# Patient Record
Sex: Female | Born: 1975
Health system: Southern US, Community
[De-identification: ages and names within clinical notes are randomized; demographics above are authoritative.]

## PROBLEM LIST (undated history)

## (undated) DIAGNOSIS — I10 Essential (primary) hypertension: Secondary | ICD-10-CM

## (undated) DIAGNOSIS — F329 Major depressive disorder, single episode, unspecified: Secondary | ICD-10-CM

## (undated) DIAGNOSIS — F32A Depression, unspecified: Secondary | ICD-10-CM

## (undated) HISTORY — DX: Depression, unspecified: F32.A

## (undated) HISTORY — DX: Essential (primary) hypertension: I10

## (undated) HISTORY — PX: CHEST TUBE INSERTION: SHX231

## (undated) HISTORY — PX: TOE SURGERY: SHX1073

## (undated) HISTORY — DX: Major depressive disorder, single episode, unspecified: F32.9

## (undated) HISTORY — PX: CHEST SURGERY: SHX595

---

## 1998-03-14 ENCOUNTER — Emergency Department (HOSPITAL_COMMUNITY): Admission: EM | Admit: 1998-03-14 | Discharge: 1998-03-14 | Payer: Self-pay | Admitting: Emergency Medicine

## 1998-08-20 ENCOUNTER — Other Ambulatory Visit: Admission: RE | Admit: 1998-08-20 | Discharge: 1998-08-20 | Payer: Self-pay | Admitting: *Deleted

## 1999-06-23 ENCOUNTER — Emergency Department (HOSPITAL_COMMUNITY): Admission: EM | Admit: 1999-06-23 | Discharge: 1999-06-23 | Payer: Self-pay | Admitting: Emergency Medicine

## 2000-10-31 ENCOUNTER — Other Ambulatory Visit: Admission: RE | Admit: 2000-10-31 | Discharge: 2000-10-31 | Payer: Self-pay | Admitting: *Deleted

## 2012-12-21 ENCOUNTER — Encounter: Payer: Self-pay | Admitting: Internal Medicine

## 2012-12-21 ENCOUNTER — Ambulatory Visit (INDEPENDENT_AMBULATORY_CARE_PROVIDER_SITE_OTHER): Payer: 59 | Admitting: Internal Medicine

## 2012-12-21 VITALS — BP 134/86 | HR 76 | Temp 99.4°F | Resp 18 | Ht 63.0 in | Wt 153.0 lb

## 2012-12-21 DIAGNOSIS — F411 Generalized anxiety disorder: Secondary | ICD-10-CM

## 2012-12-21 DIAGNOSIS — J309 Allergic rhinitis, unspecified: Secondary | ICD-10-CM | POA: Insufficient documentation

## 2012-12-21 DIAGNOSIS — F419 Anxiety disorder, unspecified: Secondary | ICD-10-CM

## 2012-12-21 DIAGNOSIS — I10 Essential (primary) hypertension: Secondary | ICD-10-CM

## 2012-12-21 DIAGNOSIS — F4310 Post-traumatic stress disorder, unspecified: Secondary | ICD-10-CM

## 2012-12-21 DIAGNOSIS — Z87828 Personal history of other (healed) physical injury and trauma: Secondary | ICD-10-CM

## 2012-12-21 MED ORDER — TRIAMTERENE-HCTZ 37.5-25 MG PO CAPS: 1.0000 | ORAL_CAPSULE | ORAL | Status: DC

## 2012-12-21 NOTE — Patient Instructions (Addendum)
See me in 4 weeks 
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
## 2013-01-10 ENCOUNTER — Encounter: Payer: Self-pay | Admitting: Internal Medicine

## 2013-01-10 ENCOUNTER — Ambulatory Visit (INDEPENDENT_AMBULATORY_CARE_PROVIDER_SITE_OTHER): Payer: 59 | Admitting: Internal Medicine

## 2013-01-10 VITALS — BP 132/87 | HR 87 | Temp 98.6°F | Resp 18 | Wt 154.0 lb

## 2013-01-10 DIAGNOSIS — J309 Allergic rhinitis, unspecified: Secondary | ICD-10-CM

## 2013-01-10 DIAGNOSIS — I10 Essential (primary) hypertension: Secondary | ICD-10-CM

## 2013-01-10 LAB — BASIC METABOLIC PANEL
Glucose, Bld: 88 mg/dL (ref 70–99)
Potassium: 4.2 mEq/L (ref 3.5–5.3)
Sodium: 135 mEq/L (ref 135–145)

## 2013-01-10 NOTE — Patient Instructions (Addendum)
Schedule cpe 

## 2013-01-10 NOTE — Progress Notes (Signed)
  Subjective:    Patient ID: Lydia Henderson, female    DOB: [birth year], 37 y.o.   MRN: [id number]  HPI Lydia Henderson  is here for follow up after initiating maxide for her BP.    She reports her headaches are less and she is tolerating maxide well  Allergies  Controlled for now   Review of Systems    see HPI Objective:   Physical Exam Physical Exam  Nursing note and vitals reviewed.  Constitutional: She is oriented to person, place, and time. She appears well-developed and well-nourished.  HENT:  Head: Normocephalic and atraumatic.  Cardiovascular: Normal rate and regular rhythm. Exam reveals no gallop and no friction rub.  No murmur heard.  Pulmonary/Chest: Breath sounds normal. She has no wheezes. She has no rales.  Neurological: She is alert and oriented to person, place, and time.  Skin: Skin is warm and dry. Ext:  No edema  Psychiatric: She has a normal mood and affect. Her behavior is normal.         Assessment & Plan:  HTN  Continue maxide   Will check K today  Allergic rhinitis  OTC med of choice  Schedule CPE

## 2013-01-15 ENCOUNTER — Encounter: Payer: Self-pay | Admitting: *Deleted

## 2013-01-15 ENCOUNTER — Ambulatory Visit (INDEPENDENT_AMBULATORY_CARE_PROVIDER_SITE_OTHER): Payer: 59 | Admitting: Internal Medicine

## 2013-01-15 ENCOUNTER — Encounter: Payer: Self-pay | Admitting: Internal Medicine

## 2013-01-15 VITALS — BP 155/95 | HR 78 | Temp 99.4°F | Resp 16

## 2013-01-15 DIAGNOSIS — R05 Cough: Secondary | ICD-10-CM

## 2013-01-15 DIAGNOSIS — J029 Acute pharyngitis, unspecified: Secondary | ICD-10-CM

## 2013-01-15 DIAGNOSIS — H6590 Unspecified nonsuppurative otitis media, unspecified ear: Secondary | ICD-10-CM

## 2013-01-15 LAB — POCT RAPID STREP A (OFFICE): Rapid Strep A Screen: NEGATIVE

## 2013-01-15 MED ORDER — AZITHROMYCIN 250 MG PO TABS: ORAL_TABLET | ORAL | Status: DC
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
## 2013-04-12 ENCOUNTER — Ambulatory Visit (INDEPENDENT_AMBULATORY_CARE_PROVIDER_SITE_OTHER): Payer: 59 | Admitting: Internal Medicine

## 2013-04-12 ENCOUNTER — Encounter: Payer: Self-pay | Admitting: Internal Medicine

## 2013-04-12 VITALS — BP 148/90 | HR 90 | Temp 99.1°F | Resp 18 | Wt 150.0 lb

## 2013-04-12 DIAGNOSIS — Z0000 Encounter for general adult medical examination without abnormal findings: Secondary | ICD-10-CM

## 2013-04-12 DIAGNOSIS — K5900 Constipation, unspecified: Secondary | ICD-10-CM | POA: Insufficient documentation

## 2013-04-12 DIAGNOSIS — Z124 Encounter for screening for malignant neoplasm of cervix: Secondary | ICD-10-CM

## 2013-04-12 DIAGNOSIS — Z1151 Encounter for screening for human papillomavirus (HPV): Secondary | ICD-10-CM

## 2013-04-12 DIAGNOSIS — F4310 Post-traumatic stress disorder, unspecified: Secondary | ICD-10-CM

## 2013-04-12 DIAGNOSIS — J309 Allergic rhinitis, unspecified: Secondary | ICD-10-CM

## 2013-04-12 DIAGNOSIS — I10 Essential (primary) hypertension: Secondary | ICD-10-CM

## 2013-04-12 LAB — COMPREHENSIVE METABOLIC PANEL
ALT: 23 U/L (ref 0–35)
Albumin: 4.8 g/dL (ref 3.5–5.2)
CO2: 25 mEq/L (ref 19–32)
Calcium: 9.4 mg/dL (ref 8.4–10.5)
Chloride: 102 mEq/L (ref 96–112)
Creat: 0.68 mg/dL (ref 0.50–1.10)
Potassium: 4.3 mEq/L (ref 3.5–5.3)
Sodium: 135 mEq/L (ref 135–145)
Total Protein: 7.6 g/dL (ref 6.0–8.3)

## 2013-04-12 LAB — LIPID PANEL
HDL: 56 mg/dL (ref >39)
Triglycerides: 239 mg/dL — ABNORMAL HIGH (ref <150)

## 2013-04-12 LAB — CBC WITH DIFFERENTIAL/PLATELET
Basophils Absolute: 0 10*3/uL (ref 0.0–0.1)
Basophils Relative: 0 % (ref 0–1)
Lymphocytes Relative: 24 % (ref 12–46)
Neutro Abs: 5 10*3/uL (ref 1.7–7.7)
Neutrophils Relative %: 68 % (ref 43–77)
Platelets: 397 10*3/uL (ref 150–400)
RDW: 14.7 % (ref 11.5–15.5)
WBC: 7.4 10*3/uL (ref 4.0–10.5)

## 2013-04-12 MED ORDER — TRIAMTERENE-HCTZ 37.5-25 MG PO CAPS: 1.0000 | ORAL_CAPSULE | ORAL | Status: DC

## 2013-04-12 NOTE — Patient Instructions (Addendum)
Take colace 1 capsule with food every day - you will see a difference in 3 days  Labs will be mailed to you  Activate my chart
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
## 2013-04-13 LAB — VITAMIN D 25 HYDROXY (VIT D DEFICIENCY, FRACTURES): Vit D, 25-Hydroxy: 23 ng/mL — ABNORMAL LOW (ref 30–89)

## 2013-04-16 ENCOUNTER — Encounter: Payer: Self-pay | Admitting: *Deleted

## 2013-04-24 ENCOUNTER — Encounter: Payer: Self-pay | Admitting: *Deleted

## 2013-10-18 ENCOUNTER — Encounter: Payer: Self-pay | Admitting: Internal Medicine

## 2013-10-18 ENCOUNTER — Ambulatory Visit (INDEPENDENT_AMBULATORY_CARE_PROVIDER_SITE_OTHER): Payer: Private Health Insurance - Indemnity | Admitting: Internal Medicine

## 2013-10-18 VITALS — BP 149/94 | HR 81 | Temp 99.0°F | Resp 18 | Wt 152.0 lb

## 2013-10-18 DIAGNOSIS — R059 Cough, unspecified: Secondary | ICD-10-CM

## 2013-10-18 DIAGNOSIS — J209 Acute bronchitis, unspecified: Secondary | ICD-10-CM

## 2013-10-18 DIAGNOSIS — I10 Essential (primary) hypertension: Secondary | ICD-10-CM

## 2013-10-18 DIAGNOSIS — R05 Cough: Secondary | ICD-10-CM

## 2013-10-18 MED ORDER — AZITHROMYCIN 250 MG PO TABS: ORAL_TABLET | ORAL | Status: DC

## 2013-10-18 MED ORDER — TRIAMTERENE-HCTZ 37.5-25 MG PO CAPS: 1.0000 | ORAL_CAPSULE | ORAL | Status: DC
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
## 2013-10-18 NOTE — Patient Instructions (Signed)
See me as needed 

## 2014-02-12 ENCOUNTER — Ambulatory Visit (INDEPENDENT_AMBULATORY_CARE_PROVIDER_SITE_OTHER): Payer: Private Health Insurance - Indemnity | Admitting: Internal Medicine

## 2014-02-12 ENCOUNTER — Encounter: Payer: Self-pay | Admitting: Internal Medicine

## 2014-02-12 VITALS — BP 126/83 | HR 78 | Temp 97.1°F | Resp 17 | Ht 63.0 in | Wt 149.0 lb

## 2014-02-12 DIAGNOSIS — Z3009 Encounter for other general counseling and advice on contraception: Secondary | ICD-10-CM

## 2014-02-12 DIAGNOSIS — I10 Essential (primary) hypertension: Secondary | ICD-10-CM

## 2014-02-12 DIAGNOSIS — N946 Dysmenorrhea, unspecified: Secondary | ICD-10-CM | POA: Insufficient documentation

## 2014-02-12 MED ORDER — NORETHIN ACE-ETH ESTRAD-FE 1-20 MG-MCG(24) PO TABS: 1.0000 | ORAL_TABLET | Freq: Every day | ORAL | Status: DC
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
## 2014-02-12 NOTE — Patient Instructions (Signed)
See me in 6 weeks   Follow up  Schedule CPE  To pharmacy  today

## 2014-03-06 ENCOUNTER — Telehealth: Payer: Self-pay | Admitting: *Deleted

## 2014-03-06 NOTE — Telephone Encounter (Signed)
Lydia Henderson has decided that she would like a more long term birth control and would like to see a Gyn.  She called asking for a referral.

## 2014-03-06 NOTE — Telephone Encounter (Signed)
Dr. Constance Goltz,  Who would you like me to refer Ms. Quashie to?

## 2014-03-11 ENCOUNTER — Other Ambulatory Visit: Payer: Self-pay | Admitting: Internal Medicine

## 2014-03-11 DIAGNOSIS — Z3002 Counseling and instruction in natural family planning to avoid pregnancy: Secondary | ICD-10-CM

## 2014-03-26 ENCOUNTER — Ambulatory Visit: Payer: Private Health Insurance - Indemnity | Admitting: Internal Medicine

## 2014-03-26 ENCOUNTER — Ambulatory Visit: Payer: Private Health Insurance - Indemnity | Admitting: Obstetrics & Gynecology

## 2014-04-11 ENCOUNTER — Ambulatory Visit (INDEPENDENT_AMBULATORY_CARE_PROVIDER_SITE_OTHER): Payer: Private Health Insurance - Indemnity | Admitting: Obstetrics & Gynecology

## 2014-04-11 ENCOUNTER — Encounter: Payer: Self-pay | Admitting: Obstetrics & Gynecology

## 2014-04-11 VITALS — BP 150/95 | HR 83 | Resp 16 | Ht 63.0 in | Wt 152.0 lb

## 2014-04-11 DIAGNOSIS — N944 Primary dysmenorrhea: Secondary | ICD-10-CM

## 2014-04-11 DIAGNOSIS — Z01812 Encounter for preprocedural laboratory examination: Secondary | ICD-10-CM

## 2014-04-11 DIAGNOSIS — Z30430 Encounter for insertion of intrauterine contraceptive device: Secondary | ICD-10-CM

## 2014-04-11 DIAGNOSIS — Z30014 Encounter for initial prescription of intrauterine contraceptive device: Secondary | ICD-10-CM

## 2014-04-11 MED ORDER — LEVONORGESTREL 20 MCG/24HR IU IUD
1.0000 | INTRAUTERINE_SYSTEM | Freq: Once | INTRAUTERINE | Status: AC
  Administered 2014-04-11: 1 via INTRAUTERINE

## 2014-04-11 NOTE — Progress Notes (Signed)
   Subjective:    Patient ID: Lydia Henderson, female    DOB: 02/09/1976, 38 y.o.   MRN: [id number]  HPI  Pt presents to discuss birth control and control of dysmenorrhea.  Pt was prescribed birth control pills but did not start them b/c she feared the emotional side effects she could experience.  She is considering the Mirena.  Pt has extremely painful periods and calls in sick to work the first day of each period.  She only takes motrin (400 mg) for pain.  Pain is much better after the first day.  Last CBC at PCP shoed no anemia.  Review of Systems  Constitutional: Negative.   Respiratory: Negative.   Cardiovascular: Negative.   Genitourinary: Positive for menstrual problem.  Psychiatric/Behavioral: Negative.        Objective:   Physical Exam  Vitals reviewed. Constitutional: She appears well-developed and well-nourished. No distress.  HENT:  Head: Normocephalic and atraumatic.  Abdominal: Soft. She exhibits no distension and no mass. There is no tenderness. There is no rebound and no guarding.  Genitourinary: Vagina normal and uterus normal.  Musculoskeletal: She exhibits no edema.  Skin: Skin is warm and dry.  Psychiatric: She has a normal mood and affect.          Assessment & Plan:  Discussed all forms of birth control Pt decided on Mirena for both its birth control, limited systemic absorption, and to help with menorrhagia.  IUD Procedure Note Patient identified, informed consent performed.  Discussed risks of irregular bleeding, cramping, infection, malpositioning or misplacement of the IUD outside the uterus which may require further procedures. Time out was performed.  Urine pregnancy test negative.  Speculum placed in the vagina.  Cervix visualized.  Cleaned with Betadine x 2.  Grasped anteriorly with a single tooth tenaculum.  Uterus sounded to 8 cm.  Mirena IUD placed per manufacturer's recommendations.  Strings trimmed to 3 cm. Tenaculum was removed, good  hemostasis noted.  Patient tolerated procedure well.   Patient was given post-procedure instructions and the Mirena care card with expiration date.  Patient was also asked to check IUD strings periodically and follow up in 4-6 weeks for IUD check.

## 2014-05-13 ENCOUNTER — Encounter: Payer: Self-pay | Admitting: Obstetrics & Gynecology

## 2014-05-27 ENCOUNTER — Encounter: Payer: Self-pay | Admitting: Obstetrics & Gynecology

## 2014-05-27 ENCOUNTER — Ambulatory Visit (INDEPENDENT_AMBULATORY_CARE_PROVIDER_SITE_OTHER): Payer: Private Health Insurance - Indemnity | Admitting: Obstetrics & Gynecology

## 2014-05-27 VITALS — BP 152/100 | HR 68 | Resp 16 | Ht 63.0 in | Wt 149.0 lb

## 2014-05-27 DIAGNOSIS — Z30431 Encounter for routine checking of intrauterine contraceptive device: Secondary | ICD-10-CM

## 2014-05-27 NOTE — Progress Notes (Signed)
   Subjective:    Patient ID: Lydia Henderson, female    DOB: 02/20/1976, 38 y.o.   MRN: [id number]  HPI  38 yo lady who is here for her string check. She has had her Mirena for 6 weeks. She has some cramping and daily spotting. Her partner does not complain of feeling the strings.  Review of Systems     Objective:   Physical Exam  Strings visible. Bloody discharge noted.      Assessment & Plan:  Spotting and cramping with Mirena. I have recommended 200 mg of IBU QID RTC RTC prn if no better

## 2014-08-08 ENCOUNTER — Other Ambulatory Visit: Payer: Self-pay | Admitting: *Deleted

## 2014-08-08 DIAGNOSIS — Z0000 Encounter for general adult medical examination without abnormal findings: Secondary | ICD-10-CM
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
## 2014-08-12 ENCOUNTER — Encounter: Payer: Private Health Insurance - Indemnity | Admitting: Internal Medicine

## 2014-08-12 DIAGNOSIS — Z0000 Encounter for general adult medical examination without abnormal findings: Secondary | ICD-10-CM

## 2014-11-19 ENCOUNTER — Other Ambulatory Visit: Payer: Self-pay | Admitting: Internal Medicine

## 2014-11-19 ENCOUNTER — Other Ambulatory Visit: Payer: Self-pay | Admitting: *Deleted

## 2014-11-19 DIAGNOSIS — I10 Essential (primary) hypertension: Secondary | ICD-10-CM

## 2014-11-19 NOTE — Telephone Encounter (Signed)
Patient will be here tomorrow to get CMP done-eh

## 2014-11-19 NOTE — Telephone Encounter (Signed)
I do not have labs on this pt since 2014  .  She needs CMP before I can refill

## 2014-11-19 NOTE — Telephone Encounter (Signed)
Refill request

## 2014-11-20 ENCOUNTER — Encounter: Payer: Self-pay | Admitting: *Deleted

## 2014-11-21 ENCOUNTER — Telehealth: Payer: Self-pay | Admitting: *Deleted

## 2014-11-21 ENCOUNTER — Other Ambulatory Visit: Payer: Self-pay | Admitting: *Deleted

## 2014-11-21 LAB — COMPLETE METABOLIC PANEL WITH GFR
ALBUMIN: 4.8 g/dL (ref 3.5–5.2)
ALK PHOS: 44 U/L (ref 39–117)
ALT: 39 U/L — ABNORMAL HIGH (ref 0–35)
AST: 54 U/L — AB (ref 0–37)
BUN: 17 mg/dL (ref 6–23)
CALCIUM: 9.6 mg/dL (ref 8.4–10.5)
CHLORIDE: 99 meq/L (ref 96–112)
CO2: 24 mEq/L (ref 19–32)
Creat: 0.56 mg/dL (ref 0.50–1.10)
GFR, Est African American: >89 mL/min
Glucose, Bld: 81 mg/dL (ref 70–99)
POTASSIUM: 5 meq/L (ref 3.5–5.3)
Sodium: 134 mEq/L — ABNORMAL LOW (ref 135–145)
Total Bilirubin: 0.7 mg/dL (ref 0.2–1.2)
Total Protein: 7.8 g/dL (ref 6.0–8.3)

## 2014-11-21 MED ORDER — TRIAMTERENE-HCTZ 37.5-25 MG PO CAPS: 1.0000 | ORAL_CAPSULE | ORAL | Status: DC

## 2014-11-21 NOTE — Telephone Encounter (Signed)
I spoke with patient in regards to her labs. I let her know that it is recommended by Dr .Constance GoltzSchoenhoff that she get her liver enzymes rechecked with her new PCP in a few weeks. A letter with this info and a copy of the labs were also mailed to her.

## 2014-11-21 NOTE — Telephone Encounter (Signed)
Refill request

## 2015-09-16 MED FILL — TRIAMTERENE/HCTZ 37.5/25 CP: 37.5-25 | 30 days supply | Qty: 30 | Fill #1

## 2015-09-18 ENCOUNTER — Emergency Department (HOSPITAL_BASED_OUTPATIENT_CLINIC_OR_DEPARTMENT_OTHER)
Admission: EM | Admit: 2015-09-18 | Discharge: 2015-09-18 | Disposition: A | Discharge status: Home / Self Care | Payer: Managed Care, Other (non HMO) | Attending: Emergency Medicine | Admitting: Emergency Medicine

## 2015-09-18 ENCOUNTER — Encounter (HOSPITAL_BASED_OUTPATIENT_CLINIC_OR_DEPARTMENT_OTHER): Payer: Self-pay | Admitting: *Deleted

## 2015-09-18 DIAGNOSIS — I10 Essential (primary) hypertension: Secondary | ICD-10-CM | POA: Diagnosis present

## 2015-09-18 DIAGNOSIS — F329 Major depressive disorder, single episode, unspecified: Secondary | ICD-10-CM | POA: Insufficient documentation

## 2015-09-18 DIAGNOSIS — Z79899 Other long term (current) drug therapy: Secondary | ICD-10-CM | POA: Diagnosis not present

## 2015-09-18 DIAGNOSIS — Z793 Long term (current) use of hormonal contraceptives: Secondary | ICD-10-CM | POA: Diagnosis not present

## 2015-09-18 DIAGNOSIS — Z87891 Personal history of nicotine dependence: Secondary | ICD-10-CM | POA: Insufficient documentation

## 2015-09-18 LAB — CBC WITH DIFFERENTIAL/PLATELET
BASOS PCT: 0 %
Basophils Absolute: 0 10*3/uL (ref 0.0–0.1)
EOS ABS: 0.2 10*3/uL (ref 0.0–0.7)
EOS PCT: 2 %
HCT: 41.4 % (ref 36.0–46.0)
Hemoglobin: 13.7 g/dL (ref 12.0–15.0)
LYMPHS PCT: 24 %
Lymphs Abs: 1.7 10*3/uL (ref 0.7–4.0)
MCH: 26.3 pg (ref 26.0–34.0)
MCHC: 33.1 g/dL (ref 30.0–36.0)
MCV: 79.6 fL (ref 78.0–100.0)
Monocytes Absolute: 0.5 10*3/uL (ref 0.1–1.0)
Monocytes Relative: 7 %
Neutro Abs: 4.6 10*3/uL (ref 1.7–7.7)
Neutrophils Relative %: 67 %
PLATELETS: 342 10*3/uL (ref 150–400)
RBC: 5.2 MIL/uL — AB (ref 3.87–5.11)
RDW: 13.7 % (ref 11.5–15.5)
WBC: 7 10*3/uL (ref 4.0–10.5)

## 2015-09-18 LAB — BASIC METABOLIC PANEL
ANION GAP: 14 (ref 5–15)
BUN: 15 mg/dL (ref 6–20)
CALCIUM: 9.2 mg/dL (ref 8.9–10.3)
CO2: 21 mmol/L — ABNORMAL LOW (ref 22–32)
Chloride: 97 mmol/L — ABNORMAL LOW (ref 101–111)
Creatinine, Ser: 0.75 mg/dL (ref 0.44–1.00)
GFR calc Af Amer: >60 mL/min (ref >60)
Glucose, Bld: 124 mg/dL — ABNORMAL HIGH (ref 65–99)
Potassium: 3.4 mmol/L — ABNORMAL LOW (ref 3.5–5.1)
SODIUM: 132 mmol/L — AB (ref 135–145)

## 2015-09-18 NOTE — ED Notes (Signed)
EDP at bedside  

## 2015-09-18 NOTE — ED Notes (Signed)
BPs verified manually in both arms by this RN

## 2015-09-18 NOTE — ED Notes (Signed)
Hypertension. She stopped taking her BP medication a week ago due to cold and taking cold medications. She started taking her medication 2 days ago. Her BP was elevated at work today.
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
## 2015-09-18 NOTE — ED Notes (Signed)
Presents with fatigued, had HA last pm

## 2015-09-18 NOTE — ED Notes (Signed)
Pt given d/c instructions. Verbalizes understanding. No questions. 

## 2015-09-18 NOTE — ED Notes (Signed)
States she had some dizziness and nausea last PM

## 2015-09-18 NOTE — Discharge Instructions (Signed)
Hypertension Hypertension, commonly called high blood pressure, is when the force of blood pumping through your arteries is too strong. Your arteries are the blood vessels that carry blood from your heart throughout your body. A blood pressure reading consists of a higher number over a lower number, such as 110/72. The higher number (systolic) is the pressure inside your arteries when your heart pumps. The lower number (diastolic) is the pressure inside your arteries when your heart relaxes. Ideally you want your blood pressure below 120/80. Hypertension forces your heart to work harder to pump blood. Your arteries may become narrow or stiff. Having untreated or uncontrolled hypertension can cause heart attack, stroke, kidney disease, and other problems. RISK FACTORS Some risk factors for high blood pressure are controllable. Others are not.  Risk factors you cannot control include:   Race. You may be at higher risk if you are African American.  Age. Risk increases with age.  Gender. Men are at higher risk than women before age 45 years. After age 65, women are at higher risk than men. Risk factors you can control include:  Not getting enough exercise or physical activity.  Being overweight.  Getting too much fat, sugar, calories, or salt in your diet.  Drinking too much alcohol. SIGNS AND SYMPTOMS Hypertension does not usually cause signs or symptoms. Extremely high blood pressure (hypertensive crisis) may cause headache, anxiety, shortness of breath, and nosebleed. DIAGNOSIS To check if you have hypertension, your health care provider will measure your blood pressure while you are seated, with your arm held at the level of your heart. It should be measured at least twice using the same arm. Certain conditions can cause a difference in blood pressure between your right and left arms. A blood pressure reading that is higher than normal on one occasion does not mean that you need treatment. If  it is not clear whether you have high blood pressure, you may be asked to return on a different day to have your blood pressure checked again. Or, you may be asked to monitor your blood pressure at home for 1 or more weeks. TREATMENT Treating high blood pressure includes making lifestyle changes and possibly taking medicine. Living a healthy lifestyle can help lower high blood pressure. You may need to change some of your habits. Lifestyle changes may include:  Following the DASH diet. This diet is high in fruits, vegetables, and whole grains. It is low in salt, red meat, and added sugars.  Keep your sodium intake below 2,300 mg per day.  Getting at least 30-45 minutes of aerobic exercise at least 4 times per week.  Losing weight if necessary.  Not smoking.  Limiting alcoholic beverages.  Learning ways to reduce stress. Your health care provider may prescribe medicine if lifestyle changes are not enough to get your blood pressure under control, and if one of the following is true:  You are 18-59 years of age and your systolic blood pressure is above 140.  You are 60 years of age or older, and your systolic blood pressure is above 150.  Your diastolic blood pressure is above 90.  You have diabetes, and your systolic blood pressure is over 140 or your diastolic blood pressure is over 90.  You have kidney disease and your blood pressure is above 140/90.  You have heart disease and your blood pressure is above 140/90. Your personal target blood pressure may vary depending on your medical conditions, your age, and other factors. HOME CARE INSTRUCTIONS    Have your blood pressure rechecked as directed by your health care provider.   Take medicines only as directed by your health care provider. Follow the directions carefully. Blood pressure medicines must be taken as prescribed. The medicine does not work as well when you skip doses. Skipping doses also puts you at risk for  problems.  Do not smoke.   Monitor your blood pressure at home as directed by your health care provider. SEEK MEDICAL CARE IF:   You think you are having a reaction to medicines taken.  You have recurrent headaches or feel dizzy.  You have swelling in your ankles.  You have trouble with your vision. SEEK IMMEDIATE MEDICAL CARE IF:  You develop a severe headache or confusion.  You have unusual weakness, numbness, or feel faint.  You have severe chest or abdominal pain.  You vomit repeatedly.  You have trouble breathing. MAKE SURE YOU:   Understand these instructions.  Will watch your condition.  Will get help right away if you are not doing well or get worse.   This information is not intended to replace advice given to you by your health care provider. Make sure you discuss any questions you have with your health care provider.   Document Released: 06/28/2005 Document Revised: 11/12/2014 Document Reviewed: 04/20/2013 Elsevier Interactive Patient Education 2016 [employer]  

## 2018-08-17 ENCOUNTER — Other Ambulatory Visit: Payer: Self-pay | Admitting: Family Medicine

## 2018-08-17 DIAGNOSIS — Z1231 Encounter for screening mammogram for malignant neoplasm of breast: Secondary | ICD-10-CM

## 2019-04-02 ENCOUNTER — Telehealth: Payer: Self-pay | Admitting: General Practice

## 2019-04-02 NOTE — Telephone Encounter (Signed)
I called and left message on patient voicemail to call office and reschedule appointment. New patient appointment has to be F2F and Dr. Ethelene Hal is not in the office on Wednesday 04/04/2019.

## 2019-04-04 ENCOUNTER — Ambulatory Visit: Payer: Private Health Insurance - Indemnity | Admitting: Family Medicine

## 2019-04-11 ENCOUNTER — Ambulatory Visit: Payer: Self-pay | Admitting: Family Medicine

## 2019-04-23 ENCOUNTER — Ambulatory Visit (INDEPENDENT_AMBULATORY_CARE_PROVIDER_SITE_OTHER): Payer: 59 | Admitting: Family Medicine

## 2019-04-23 ENCOUNTER — Other Ambulatory Visit: Payer: Self-pay

## 2019-04-23 ENCOUNTER — Encounter: Payer: Self-pay | Admitting: Family Medicine

## 2019-04-23 VITALS — BP 138/80 | HR 101 | Ht 63.0 in | Wt 146.0 lb

## 2019-04-23 DIAGNOSIS — I10 Essential (primary) hypertension: Secondary | ICD-10-CM

## 2019-04-23 DIAGNOSIS — R7989 Other specified abnormal findings of blood chemistry: Secondary | ICD-10-CM | POA: Diagnosis not present

## 2019-04-23 DIAGNOSIS — Z23 Encounter for immunization: Secondary | ICD-10-CM | POA: Diagnosis not present

## 2019-04-23 LAB — COMPREHENSIVE METABOLIC PANEL
ALT: 38 U/L — ABNORMAL HIGH (ref 0–35)
AST: 29 U/L (ref 0–37)
Albumin: 4.3 g/dL (ref 3.5–5.2)
Alkaline Phosphatase: 53 U/L (ref 39–117)
BUN: 13 mg/dL (ref 6–23)
CO2: 23 mEq/L (ref 19–32)
Calcium: 8.9 mg/dL (ref 8.4–10.5)
Chloride: 103 mEq/L (ref 96–112)
Creatinine, Ser: 0.6 mg/dL (ref 0.40–1.20)
GFR: 109.31 mL/min (ref >60.00)
Glucose, Bld: 91 mg/dL (ref 70–99)
Potassium: 4 mEq/L (ref 3.5–5.1)
Sodium: 135 mEq/L (ref 135–145)
Total Bilirubin: 1.3 mg/dL — ABNORMAL HIGH (ref 0.2–1.2)
Total Protein: 7.1 g/dL (ref 6.0–8.3)

## 2019-04-23 LAB — CBC
HCT: 41.5 % (ref 36.0–46.0)
Hemoglobin: 13.4 g/dL (ref 12.0–15.0)
MCHC: 32.4 g/dL (ref 30.0–36.0)
MCV: 83.3 fl (ref 78.0–100.0)
Platelets: 393 10*3/uL (ref 150.0–400.0)
RBC: 4.98 Mil/uL (ref 3.87–5.11)
RDW: 13.6 % (ref 11.5–15.5)
WBC: 5.3 10*3/uL (ref 4.0–10.5)

## 2019-04-23 MED ORDER — CHLORTHALIDONE 25 MG PO TABS: 25.0000 mg | ORAL_TABLET | Freq: Every day | ORAL | 0 refills | Status: DC

## 2019-04-23 NOTE — Patient Instructions (Signed)
Hypertension, Adult High blood pressure (hypertension) is when the force of blood pumping through the arteries is too strong. The arteries are the blood vessels that carry blood from the heart throughout the body. Hypertension forces the heart to work harder to pump blood and may cause arteries to become narrow or stiff. Untreated or uncontrolled hypertension can cause a heart attack, heart failure, a stroke, kidney disease, and other problems. A blood pressure reading consists of a higher number over a lower number. Ideally, your blood pressure should be below 120/80. The first ("top") number is called the systolic pressure. It is a measure of the pressure in your arteries as your heart beats. The second ("bottom") number is called the diastolic pressure. It is a measure of the pressure in your arteries as the heart relaxes. What are the causes? The exact cause of this condition is not known. There are some conditions that result in or are related to high blood pressure. What increases the risk? Some risk factors for high blood pressure are under your control. The following factors may make you more likely to develop this condition:  Smoking.  Having type 2 diabetes mellitus, high cholesterol, or both.  Not getting enough exercise or physical activity.  Being overweight.  Having too much fat, sugar, calories, or salt (sodium) in your diet.  Drinking too much alcohol. Some risk factors for high blood pressure may be difficult or impossible to change. Some of these factors include:  Having chronic kidney disease.  Having a family history of high blood pressure.  Age. Risk increases with age.  Race. You may be at higher risk if you are African American.  Gender. Men are at higher risk than women before age 27. After age 17, women are at higher risk than men.  Having obstructive sleep apnea.  Stress. What are the signs or symptoms? High blood pressure may not cause symptoms. Very high  blood pressure (hypertensive crisis) may cause:  Headache.  Anxiety.  Shortness of breath.  Nosebleed.  Nausea and vomiting.  Vision changes.  Severe chest pain.  Seizures. How is this diagnosed? This condition is diagnosed by measuring your blood pressure while you are seated, with your arm resting on a flat surface, your legs uncrossed, and your feet flat on the floor. The cuff of the blood pressure monitor will be placed directly against the skin of your upper arm at the level of your heart. It should be measured at least twice using the same arm. Certain conditions can cause a difference in blood pressure between your right and left arms. Certain factors can cause blood pressure readings to be lower or higher than normal for a short period of time:  When your blood pressure is higher when you are in a health care provider's office than when you are at home, this is called white coat hypertension. Most people with this condition do not need medicines.  When your blood pressure is higher at home than when you are in a health care provider's office, this is called masked hypertension. Most people with this condition may need medicines to control blood pressure. If you have a high blood pressure reading during one visit or you have normal blood pressure with other risk factors, you may be asked to:  Return on a different day to have your blood pressure checked again.  Monitor your blood pressure at home for 1 week or longer. If you are diagnosed with hypertension, you may have other blood or  imaging tests to help your health care provider understand your overall risk for other conditions. How is this treated? This condition is treated by making healthy lifestyle changes, such as eating healthy foods, exercising more, and reducing your alcohol intake. Your health care provider may prescribe medicine if lifestyle changes are not enough to get your blood pressure under control, and  if:  Your systolic blood pressure is above 130.  Your diastolic blood pressure is above 80. Your personal target blood pressure may vary depending on your medical conditions, your age, and other factors. Follow these instructions at home: Eating and drinking   Eat a diet that is high in fiber and potassium, and low in sodium, added sugar, and fat. An example eating plan is called the DASH (Dietary Approaches to Stop Hypertension) diet. To eat this way: ? Eat plenty of fresh fruits and vegetables. Try to fill one half of your plate at each meal with fruits and vegetables. ? Eat whole grains, such as whole-wheat pasta, brown rice, or whole-grain bread. Fill about one fourth of your plate with whole grains. ? Eat or drink low-fat dairy products, such as skim milk or low-fat yogurt. ? Avoid fatty cuts of meat, processed or cured meats, and poultry with skin. Fill about one fourth of your plate with lean proteins, such as fish, chicken without skin, beans, eggs, or tofu. ? Avoid pre-made and processed foods. These tend to be higher in sodium, added sugar, and fat.  Reduce your daily sodium intake. Most people with hypertension should eat less than 1,500 mg of sodium a day.  Do not drink alcohol if: ? Your health care provider tells you not to drink. ? You are pregnant, may be pregnant, or are planning to become pregnant.  If you drink alcohol: ? Limit how much you use to:  0-1 drink a day for women.  0-2 drinks a day for men. ? Be aware of how much alcohol is in your drink. In the [place] one drink equals one 12 oz bottle of beer (355 mL), one 5 oz glass of wine (148 mL), or one 1 oz glass of hard liquor (44 mL). Lifestyle   Work with your health care provider to maintain a healthy body weight or to lose weight. Ask what an ideal weight is for you.  Get at least 30 minutes of exercise most days of the week. Activities may include walking, swimming, or biking.  Include exercise to  strengthen your muscles (resistance exercise), such as Pilates or lifting weights, as part of your weekly exercise routine. Try to do these types of exercises for 30 minutes at least 3 days a week.  Do not use any products that contain nicotine or tobacco, such as cigarettes, e-cigarettes, and chewing tobacco. If you need help quitting, ask your health care provider.  Monitor your blood pressure at home as told by your health care provider.  Keep all follow-up visits as told by your health care provider. This is important. Medicines  Take over-the-counter and prescription medicines only as told by your health care provider. Follow directions carefully. Blood pressure medicines must be taken as prescribed.  Do not skip doses of blood pressure medicine. Doing this puts you at risk for problems and can make the medicine less effective.  Ask your health care provider about side effects or reactions to medicines that you should watch for. Contact a health care provider if you:  Think you are having a reaction to a medicine you  are taking.  Have headaches that keep coming back (recurring).  Feel dizzy.  Have swelling in your ankles.  Have trouble with your vision. Get help right away if you:  Develop a severe headache or confusion.  Have unusual weakness or numbness.  Feel faint.  Have severe pain in your chest or abdomen.  Vomit repeatedly.  Have trouble breathing. Summary  Hypertension is when the force of blood pumping through your arteries is too strong. If this condition is not controlled, it may put you at risk for serious complications.  Your personal target blood pressure may vary depending on your medical conditions, your age, and other factors. For most people, a normal blood pressure is less than 120/80.  Hypertension is treated with lifestyle changes, medicines, or a combination of both. Lifestyle changes include losing weight, eating a healthy, low-sodium diet,  exercising more, and limiting alcohol. This information is not intended to replace advice given to you by your health care provider. Make sure you discuss any questions you have with your health care provider. Document Released: 06/28/2005 Document Revised: 03/08/2018 Document Reviewed: 03/08/2018 Elsevier Patient Education  2020 [employer].  Managing Your Hypertension Hypertension is commonly called high blood pressure. This is when the force of your blood pressing against the walls of your arteries is too strong. Arteries are blood vessels that carry blood from your heart throughout your body. Hypertension forces the heart to work harder to pump blood, and may cause the arteries to become narrow or stiff. Having untreated or uncontrolled hypertension can cause heart attack, stroke, kidney disease, and other problems. What are blood pressure readings? A blood pressure reading consists of a higher number over a lower number. Ideally, your blood pressure should be below 120/80. The first ("top") number is called the systolic pressure. It is a measure of the pressure in your arteries as your heart beats. The second ("bottom") number is called the diastolic pressure. It is a measure of the pressure in your arteries as the heart relaxes. What does my blood pressure reading mean? Blood pressure is classified into four stages. Based on your blood pressure reading, your health care provider may use the following stages to determine what type of treatment you need, if any. Systolic pressure and diastolic pressure are measured in a unit called mm Hg. Normal  Systolic pressure: below 120.  Diastolic pressure: below 80. Elevated  Systolic pressure: 120-129.  Diastolic pressure: below 80. Hypertension stage 1  Systolic pressure: 130-139.  Diastolic pressure: 80-89. Hypertension stage 2  Systolic pressure: 140 or above.  Diastolic pressure: 90 or above. What health risks are associated with  hypertension? Managing your hypertension is an important responsibility. Uncontrolled hypertension can lead to:  A heart attack.  A stroke.  A weakened blood vessel (aneurysm).  Heart failure.  Kidney damage.  Eye damage.  Metabolic syndrome.  Memory and concentration problems. What changes can I make to manage my hypertension? Hypertension can be managed by making lifestyle changes and possibly by taking medicines. Your health care provider will help you make a plan to bring your blood pressure within a normal range. Eating and drinking   Eat a diet that is high in fiber and potassium, and low in salt (sodium), added sugar, and fat. An example eating plan is called the DASH (Dietary Approaches to Stop Hypertension) diet. To eat this way: ? Eat plenty of fresh fruits and vegetables. Try to fill half of your plate at each meal with fruits and vegetables. ?  Eat whole grains, such as whole wheat pasta, brown rice, or whole grain bread. Fill about one quarter of your plate with whole grains. ? Eat low-fat diary products. ? Avoid fatty cuts of meat, processed or cured meats, and poultry with skin. Fill about one quarter of your plate with lean proteins such as fish, chicken without skin, beans, eggs, and tofu. ? Avoid premade and processed foods. These tend to be higher in sodium, added sugar, and fat.  Reduce your daily sodium intake. Most people with hypertension should eat less than 1,500 mg of sodium a day.  Limit alcohol intake to no more than 1 drink a day for nonpregnant women and 2 drinks a day for men. One drink equals 12 oz of beer, 5 oz of wine, or 1 oz of hard liquor. Lifestyle  Work with your health care provider to maintain a healthy body weight, or to lose weight. Ask what an ideal weight is for you.  Get at least 30 minutes of exercise that causes your heart to beat faster (aerobic exercise) most days of the week. Activities may include walking, swimming, or  biking.  Include exercise to strengthen your muscles (resistance exercise), such as weight lifting, as part of your weekly exercise routine. Try to do these types of exercises for 30 minutes at least 3 days a week.  Do not use any products that contain nicotine or tobacco, such as cigarettes and e-cigarettes. If you need help quitting, ask your health care provider.  Control any long-term (chronic) conditions you have, such as high cholesterol or diabetes. Monitoring  Monitor your blood pressure at home as told by your health care provider. Your personal target blood pressure may vary depending on your medical conditions, your age, and other factors.  Have your blood pressure checked regularly, as often as told by your health care provider. Working with your health care provider  Review all the medicines you take with your health care provider because there may be side effects or interactions.  Talk with your health care provider about your diet, exercise habits, and other lifestyle factors that may be contributing to hypertension.  Visit your health care provider regularly. Your health care provider can help you create and adjust your plan for managing hypertension. Will I need medicine to control my blood pressure? Your health care provider may prescribe medicine if lifestyle changes are not enough to get your blood pressure under control, and if:  Your systolic blood pressure is 130 or higher.  Your diastolic blood pressure is 80 or higher. Take medicines only as told by your health care provider. Follow the directions carefully. Blood pressure medicines must be taken as prescribed. The medicine does not work as well when you skip doses. Skipping doses also puts you at risk for problems. Contact a health care provider if:  You think you are having a reaction to medicines you have taken.  You have repeated (recurrent) headaches.  You feel dizzy.  You have swelling in your  ankles.  You have trouble with your vision. Get help right away if:  You develop a severe headache or confusion.  You have unusual weakness or numbness, or you feel faint.  You have severe pain in your chest or abdomen.  You vomit repeatedly.  You have trouble breathing. Summary  Hypertension is when the force of blood pumping through your arteries is too strong. If this condition is not controlled, it may put you at risk for serious complications.  Your  personal target blood pressure may vary depending on your medical conditions, your age, and other factors. For most people, a normal blood pressure is less than 120/80.  Hypertension is managed by lifestyle changes, medicines, or both. Lifestyle changes include weight loss, eating a healthy, low-sodium diet, exercising more, and limiting alcohol. This information is not intended to replace advice given to you by your health care provider. Make sure you discuss any questions you have with your health care provider. Document Released: 03/22/2012 Document Revised: 10/20/2018 Document Reviewed: 05/26/2016 Elsevier Patient Education  2020 [employer]  DASH Eating Plan DASH stands for "Dietary Approaches to Stop Hypertension." The DASH eating plan is a healthy eating plan that has been shown to reduce high blood pressure (hypertension). It may also reduce your risk for type 2 diabetes, heart disease, and stroke. The DASH eating plan may also help with weight loss. What are tips for following this plan?  General guidelines  Avoid eating more than 2,300 mg (milligrams) of salt (sodium) a day. If you have hypertension, you may need to reduce your sodium intake to 1,500 mg a day.  Limit alcohol intake to no more than 1 drink a day for nonpregnant women and 2 drinks a day for men. One drink equals 12 oz of beer, 5 oz of wine, or 1 oz of hard liquor.  Work with your health care provider to maintain a healthy body weight or to lose weight.  Ask what an ideal weight is for you.  Get at least 30 minutes of exercise that causes your heart to beat faster (aerobic exercise) most days of the week. Activities may include walking, swimming, or biking.  Work with your health care provider or diet and nutrition specialist (dietitian) to adjust your eating plan to your individual calorie needs. Reading food labels   Check food labels for the amount of sodium per serving. Choose foods with less than 5 percent of the Daily Value of sodium. Generally, foods with less than 300 mg of sodium per serving fit into this eating plan.  To find whole grains, look for the word "whole" as the first word in the ingredient list. Shopping  Buy products labeled as "low-sodium" or "no salt added."  Buy fresh foods. Avoid canned foods and premade or frozen meals. Cooking  Avoid adding salt when cooking. Use salt-free seasonings or herbs instead of table salt or sea salt. Check with your health care provider or pharmacist before using salt substitutes.  Do not fry foods. Cook foods using healthy methods such as baking, boiling, grilling, and broiling instead.  Cook with heart-healthy oils, such as olive, canola, soybean, or sunflower oil. Meal planning  Eat a balanced diet that includes: ? 5 or more servings of fruits and vegetables each day. At each meal, try to fill half of your plate with fruits and vegetables. ? Up to 6-8 servings of whole grains each day. ? Less than 6 oz of lean meat, poultry, or fish each day. A 3-oz serving of meat is about the same size as a deck of cards. One egg equals 1 oz. ? 2 servings of low-fat dairy each day. ? A serving of nuts, seeds, or beans 5 times each week. ? Heart-healthy fats. Healthy fats called Omega-3 fatty acids are found in foods such as flaxseeds and coldwater fish, like sardines, salmon, and mackerel.  Limit how much you eat of the following: ? Canned or prepackaged foods. ? Food that is high in trans  fat,  such as fried foods. ? Food that is high in saturated fat, such as fatty meat. ? Sweets, desserts, sugary drinks, and other foods with added sugar. ? Full-fat dairy products.  Do not salt foods before eating.  Try to eat at least 2 vegetarian meals each week.  Eat more home-cooked food and less restaurant, buffet, and fast food.  When eating at a restaurant, ask that your food be prepared with less salt or no salt, if possible. What foods are recommended? The items listed may not be a complete list. Talk with your dietitian about what dietary choices are best for you. Grains Whole-grain or whole-wheat bread. Whole-grain or whole-wheat pasta. Brown rice. Orpah Cobbatmeal. Quinoa. Bulgur. Whole-grain and low-sodium cereals. Pita bread. Low-fat, low-sodium crackers. Whole-wheat flour tortillas. Vegetables Fresh or frozen vegetables (raw, steamed, roasted, or grilled). Low-sodium or reduced-sodium tomato and vegetable juice. Low-sodium or reduced-sodium tomato sauce and tomato paste. Low-sodium or reduced-sodium canned vegetables. Fruits All fresh, dried, or frozen fruit. Canned fruit in natural juice (without added sugar). Meat and other protein foods Skinless chicken or [place]urkey. Ground chicken or [place]key. Pork with fat trimmed off. Fish and seafood. Egg whites. Dried beans, peas, or lentils. Unsalted nuts, nut butters, and seeds. Unsalted canned beans. Lean cuts of beef with fat trimmed off. Low-sodium, lean deli meat. Dairy Low-fat (1%) or fat-free (skim) milk. Fat-free, low-fat, or reduced-fat cheeses. Nonfat, low-sodium ricotta or cottage cheese. Low-fat or nonfat yogurt. Low-fat, low-sodium cheese. Fats and oils Soft margarine without trans fats. Vegetable oil. Low-fat, reduced-fat, or light mayonnaise and salad dressings (reduced-sodium). Canola, safflower, olive, soybean, and sunflower oils. Avocado. Seasoning and other foods Herbs. Spices. Seasoning mixes without salt. Unsalted popcorn and  pretzels. Fat-free sweets. What foods are not recommended? The items listed may not be a complete list. Talk with your dietitian about what dietary choices are best for you. Grains Baked goods made with fat, such as croissants, muffins, or some breads. Dry pasta or rice meal packs. Vegetables Creamed or fried vegetables. Vegetables in a cheese sauce. Regular canned vegetables (not low-sodium or reduced-sodium). Regular canned tomato sauce and paste (not low-sodium or reduced-sodium). Regular tomato and vegetable juice (not low-sodium or reduced-sodium). Rosita FirePickles. Olives. Fruits Canned fruit in a light or heavy syrup. Fried fruit. Fruit in cream or butter sauce. Meat and other protein foods Fatty cuts of meat. Ribs. Fried meat. Tomasa BlaseBacon. Sausage. Bologna and other processed lunch meats. Salami. Fatback. Hotdogs. Bratwurst. Salted nuts and seeds. Canned beans with added salt. Canned or smoked fish. Whole eggs or egg yolks. Chicken or [place]key with skin. Dairy Whole or 2% milk, cream, and half-and-half. Whole or full-fat cream cheese. Whole-fat or sweetened yogurt. Full-fat cheese. Nondairy creamers. Whipped toppings. Processed cheese and cheese spreads. Fats and oils Butter. Stick margarine. Lard. Shortening. Ghee. Bacon fat. Tropical oils, such as coconut, palm kernel, or palm oil. Seasoning and other foods Salted popcorn and pretzels. Onion salt, garlic salt, seasoned salt, table salt, and sea salt. Worcestershire sauce. Tartar sauce. Barbecue sauce. Teriyaki sauce. Soy sauce, including reduced-sodium. Steak sauce. Canned and packaged gravies. Fish sauce. Oyster sauce. Cocktail sauce. Horseradish that you find on the shelf. Ketchup. Mustard. Meat flavorings and tenderizers. Bouillon cubes. Hot sauce and Tabasco sauce. Premade or packaged marinades. Premade or packaged taco seasonings. Relishes. Regular salad dressings. Where to find more information:  National Heart, Lung, and Blood Institute:  [employer].iswww.nhlbi.nih.gov  American Heart Association: www.heart.org Summary  The DASH eating plan is a healthy eating plan that has been  shown to reduce high blood pressure (hypertension). It may also reduce your risk for type 2 diabetes, heart disease, and stroke.  With the DASH eating plan, you should limit salt (sodium) intake to 2,300 mg a day. If you have hypertension, you may need to reduce your sodium intake to 1,500 mg a day.  When on the DASH eating plan, aim to eat more fresh fruits and vegetables, whole grains, lean proteins, low-fat dairy, and heart-healthy fats.  Work with your health care provider or diet and nutrition specialist (dietitian) to adjust your eating plan to your individual calorie needs. This information is not intended to replace advice given to you by your health care provider. Make sure you discuss any questions you have with your health care provider. Document Released: 06/17/2011 Document Revised: 06/10/2017 Document Reviewed: 06/21/2016 Elsevier Patient Education  2020 [employer].
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
## 2019-04-24 DIAGNOSIS — Z23 Encounter for immunization: Secondary | ICD-10-CM | POA: Insufficient documentation

## 2019-04-24 DIAGNOSIS — R7989 Other specified abnormal findings of blood chemistry: Secondary | ICD-10-CM | POA: Insufficient documentation

## 2019-04-24 NOTE — Addendum Note (Signed)
Addended by: Jon Billings on: 04/24/2019 04:45 PM   Modules accepted: Orders

## 2019-05-03 ENCOUNTER — Telehealth: Payer: Self-pay

## 2019-05-04 ENCOUNTER — Other Ambulatory Visit: Payer: Self-pay

## 2019-05-04 ENCOUNTER — Other Ambulatory Visit (INDEPENDENT_AMBULATORY_CARE_PROVIDER_SITE_OTHER): Payer: 59

## 2019-05-04 DIAGNOSIS — R7989 Other specified abnormal findings of blood chemistry: Secondary | ICD-10-CM

## 2019-05-04 LAB — LIPID PANEL
Cholesterol: 223 mg/dL — ABNORMAL HIGH (ref 0–200)
HDL: 66.8 mg/dL (ref >39.00)
NonHDL: 156.02
Total CHOL/HDL Ratio: 3
Triglycerides: 232 mg/dL — ABNORMAL HIGH (ref 0.0–149.0)
VLDL: 46.4 mg/dL — ABNORMAL HIGH (ref 0.0–40.0)

## 2019-05-04 LAB — LDL CHOLESTEROL, DIRECT: Direct LDL: 132 mg/dL

## 2019-05-04 NOTE — Telephone Encounter (Signed)
error 

## 2019-05-07 LAB — HEPATITIS C ANTIBODY
Hepatitis C Ab: NONREACTIVE
SIGNAL TO CUT-OFF: 0.01 (ref <1.00)

## 2019-05-07 LAB — HEPATITIS B SURFACE ANTIGEN: Hepatitis B Surface Ag: NONREACTIVE

## 2019-05-07 LAB — HIV ANTIBODY (ROUTINE TESTING W REFLEX): HIV 1&2 Ab, 4th Generation: NONREACTIVE

## 2019-05-11 ENCOUNTER — Other Ambulatory Visit: Payer: Self-pay

## 2019-05-11 ENCOUNTER — Encounter (HOSPITAL_BASED_OUTPATIENT_CLINIC_OR_DEPARTMENT_OTHER): Payer: Self-pay

## 2019-05-11 ENCOUNTER — Emergency Department (HOSPITAL_BASED_OUTPATIENT_CLINIC_OR_DEPARTMENT_OTHER)
Admission: EM | Admit: 2019-05-11 | Discharge: 2019-05-11 | Disposition: A | Discharge status: Home / Self Care | Payer: 59 | Attending: Emergency Medicine | Admitting: Emergency Medicine

## 2019-05-11 DIAGNOSIS — R040 Epistaxis: Secondary | ICD-10-CM | POA: Diagnosis present

## 2019-05-11 DIAGNOSIS — Z79899 Other long term (current) drug therapy: Secondary | ICD-10-CM | POA: Diagnosis not present

## 2019-05-11 DIAGNOSIS — I10 Essential (primary) hypertension: Secondary | ICD-10-CM | POA: Insufficient documentation

## 2019-05-11 DIAGNOSIS — F17210 Nicotine dependence, cigarettes, uncomplicated: Secondary | ICD-10-CM | POA: Insufficient documentation

## 2019-05-11 MED ORDER — LIDOCAINE-EPINEPHRINE-TETRACAINE (LET) SOLUTION
3.0000 mL | Freq: Once | NASAL | Status: AC
  Administered 2019-05-11: 3 mL via TOPICAL
  Filled 2019-05-11: qty 3

## 2019-05-11 MED ORDER — OXYMETAZOLINE HCL 0.05 % NA SOLN
1.0000 | Freq: Once | NASAL | Status: AC
  Administered 2019-05-11: 16:00:00 1 via NASAL
  Filled 2019-05-11: qty 30

## 2019-05-11 MED ORDER — SILVER NITRATE-POT NITRATE 75-25 % EX MISC
1.0000 "application " | Freq: Once | CUTANEOUS | Status: AC
  Administered 2019-05-11: 1 via TOPICAL
  Filled 2019-05-11: qty 1

## 2019-05-11 MED ORDER — SALINE SPRAY 0.65 % NA SOLN: 1.0000 | NASAL | 0 refills | Status: AC | PRN

## 2019-05-11 NOTE — ED Notes (Signed)
Nasal packing is saturated with blood, replaced with another nasal packing.  Second time, patient sneezed and the nasal packing came out.  Bloody nasal drainage  noted.
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
## 2019-05-11 NOTE — ED Triage Notes (Signed)
Pt states nosebleed started today around 1pm, was seen at [facility] and was told to come here for evaluation. A&Ox4 NAD

## 2019-05-11 NOTE — ED Notes (Signed)
ED Provider at bedside. 

## 2019-05-11 NOTE — Discharge Instructions (Signed)
Take your Zyrtec daily.  Use nasal saline daily to keep your nasal passages moist.  Use Afrin no longer than 3 days.  Please follow-up with the ear, nose, throat doctor, Dr. Blenda Nicely, for further evaluation and treatment of your nosebleeds.  Please return the emergency department if you develop any new or worsening symptoms including return of bleeding.

## 2019-05-11 NOTE — ED Notes (Signed)
Nose bleeding is still voiced.  Nasal packing intact.

## 2019-05-14 NOTE — Addendum Note (Signed)
Addended by: Jon Billings on: 05/14/2019 12:53 PM   Modules accepted: Orders

## 2019-05-18 ENCOUNTER — Other Ambulatory Visit: Payer: 59

## 2019-05-24 ENCOUNTER — Ambulatory Visit
Admission: RE | Admit: 2019-05-24 | Discharge: 2019-05-24 | Disposition: A | Discharge status: Home / Self Care | Payer: 59 | Source: Ambulatory Visit | Attending: Family Medicine | Admitting: Family Medicine

## 2019-05-24 DIAGNOSIS — R7989 Other specified abnormal findings of blood chemistry: Secondary | ICD-10-CM

## 2019-05-25 ENCOUNTER — Ambulatory Visit (INDEPENDENT_AMBULATORY_CARE_PROVIDER_SITE_OTHER): Payer: 59 | Admitting: Family Medicine

## 2019-05-25 ENCOUNTER — Other Ambulatory Visit: Payer: Self-pay

## 2019-05-25 ENCOUNTER — Encounter: Payer: Self-pay | Admitting: Family Medicine

## 2019-05-25 VITALS — BP 130/80 | HR 96 | Ht 63.0 in | Wt 145.0 lb

## 2019-05-25 DIAGNOSIS — Z566 Other physical and mental strain related to work: Secondary | ICD-10-CM | POA: Diagnosis not present

## 2019-05-25 DIAGNOSIS — I10 Essential (primary) hypertension: Secondary | ICD-10-CM

## 2019-05-25 DIAGNOSIS — K760 Fatty (change of) liver, not elsewhere classified: Secondary | ICD-10-CM | POA: Diagnosis not present

## 2019-05-25 DIAGNOSIS — R7989 Other specified abnormal findings of blood chemistry: Secondary | ICD-10-CM | POA: Diagnosis not present

## 2019-05-25 MED ORDER — PAROXETINE HCL 10 MG PO TABS: 10.0000 mg | ORAL_TABLET | Freq: Every day | ORAL | 1 refills | Status: DC

## 2019-05-25 MED ORDER — FISH OIL OMEGA-3 1000 MG PO CAPS: 2.0000 | ORAL_CAPSULE | Freq: Every day | ORAL | 5 refills | Status: AC

## 2019-05-25 MED ORDER — CHLORTHALIDONE 25 MG PO TABS: 25.0000 mg | ORAL_TABLET | Freq: Every day | ORAL | 0 refills | Status: DC

## 2019-05-25 NOTE — Patient Instructions (Signed)
Mindfulness-Based Stress Reduction Mindfulness-based stress reduction (MBSR) is a program that helps people learn to practice mindfulness. Mindfulness is the practice of intentionally paying attention to the present moment. It can be learned and practiced through techniques such as education, breathing exercises, meditation, and yoga. MBSR includes several mindfulness techniques in one program. MBSR works best when you understand the treatment, are willing to try new things, and can commit to spending time practicing what you learn. MBSR training may include learning about:  How your emotions, thoughts, and reactions affect your body.  New ways to respond to things that cause negative thoughts to start (triggers).  How to notice your thoughts and let go of them.  Practicing awareness of everyday things that you normally do without thinking.  The techniques and goals of different types of meditation. What are the benefits of MBSR? MBSR can have many benefits, which include helping you to:  Develop self-awareness. This refers to knowing and understanding yourself.  Learn skills and attitudes that help you to participate in your own health care.  Learn new ways to care for yourself.  Be more accepting about how things are, and let things go.  Be less judgmental and approach things with an open mind.  Be patient with yourself and trust yourself more. MBSR has also been shown to:  Reduce negative emotions, such as depression and anxiety.  Improve memory and focus.  Change how you sense and approach pain.  Boost your body's ability to fight infections.  Help you connect better with other people.  Improve your sense of well-being. Follow these instructions at home:   Find a local in-person or online MBSR program.  Set aside some time regularly for mindfulness practice.  Find a mindfulness practice that works best for you. This may include one or more of the following: ?  Meditation. Meditation involves focusing your mind on a certain thought or activity. ? Breathing awareness exercises. These help you to stay present by focusing on your breath. ? Body scan. For this practice, you lie down and pay attention to each part of your body from head to toe. You can identify tension and soreness and intentionally relax parts of your body. ? Yoga. Yoga involves stretching and breathing, and it can improve your ability to move and be flexible. It can also provide an experience of testing your body's limits, which can help you release stress. ? Mindful eating. This way of eating involves focusing on the taste, texture, color, and smell of each bite of food. Because this slows down eating and helps you feel full sooner, it can be an important part of a weight-loss plan.  Find a podcast or recording that provides guidance for breathing awareness, body scan, or meditation exercises. You can listen to these any time when you have a free moment to rest without distractions.  Follow your treatment plan as told by your health care provider. This may include taking regular medicines and making changes to your diet or lifestyle as recommended. How to practice mindfulness To do a basic awareness exercise:  Find a comfortable place to sit.  Pay attention to the present moment. Observe your thoughts, feelings, and surroundings just as they are.  Avoid placing judgment on yourself, your feelings, or your surroundings. Make note of any judgment that comes up, and let it go.  Your mind may wander, and that is okay. Make note of when your thoughts drift, and return your attention to the present moment. To do   basic mindfulness meditation:  Find a comfortable place to sit. This may include a stable chair or a firm floor cushion. ? Sit upright with your back straight. Let your arms fall next to your side with your hands resting on your legs. ? If sitting in a chair, rest your feet flat on  the floor. ? If sitting on a cushion, cross your legs in front of you.  Keep your head in a neutral position with your chin dropped slightly. Relax your jaw and rest the tip of your tongue on the roof of your mouth. Drop your gaze to the floor. You can close your eyes if you like.  Breathe normally and pay attention to your breath. Feel the air moving in and out of your nose. Feel your belly expanding and relaxing with each breath.  Your mind may wander, and that is okay. Make note of when your thoughts drift, and return your attention to your breath.  Avoid placing judgment on yourself, your feelings, or your surroundings. Make note of any judgment or feelings that come up, let them go, and bring your attention back to your breath.  When you are ready, lift your gaze or open your eyes. Pay attention to how your body feels after the meditation. Where to find more information You can find more information about MBSR from:  Your health care provider.  Community-based meditation centers or programs.  Programs offered near you. Summary  Mindfulness-based stress reduction (MBSR) is a program that teaches you how to intentionally pay attention to the present moment. It is used with other treatments to help you cope better with daily stress, emotions, and pain.  MBSR focuses on developing self-awareness, which allows you to respond to life stress without judgment or negative emotions.  MBSR programs may involve learning different mindfulness practices, such as breathing exercises, meditation, yoga, body scan, or mindful eating. Find a mindfulness practice that works best for you, and set aside time for it on a regular basis. This information is not intended to replace advice given to you by your health care provider. Make sure you discuss any questions you have with your health care provider. Document Released: 11/04/2016 Document Revised: 06/10/2017 Document Reviewed: 11/04/2016 Elsevier  Patient Education  2020 [employer]  
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
## 2019-07-23 ENCOUNTER — Other Ambulatory Visit: Payer: Self-pay | Admitting: Family Medicine

## 2019-07-23 DIAGNOSIS — I10 Essential (primary) hypertension: Secondary | ICD-10-CM

## 2019-07-26 ENCOUNTER — Ambulatory Visit (INDEPENDENT_AMBULATORY_CARE_PROVIDER_SITE_OTHER): Payer: Managed Care, Other (non HMO) | Admitting: Family Medicine

## 2019-07-26 ENCOUNTER — Other Ambulatory Visit: Payer: Self-pay

## 2019-07-26 ENCOUNTER — Encounter: Payer: Self-pay | Admitting: Family Medicine

## 2019-07-26 VITALS — BP 138/92 | HR 87 | Temp 97.7°F | Ht 63.0 in | Wt 150.0 lb

## 2019-07-26 DIAGNOSIS — I10 Essential (primary) hypertension: Secondary | ICD-10-CM | POA: Diagnosis not present

## 2019-07-26 DIAGNOSIS — R7989 Other specified abnormal findings of blood chemistry: Secondary | ICD-10-CM

## 2019-07-26 DIAGNOSIS — Z566 Other physical and mental strain related to work: Secondary | ICD-10-CM

## 2019-07-26 DIAGNOSIS — K760 Fatty (change of) liver, not elsewhere classified: Secondary | ICD-10-CM | POA: Diagnosis not present

## 2019-07-26 MED ORDER — AMLODIPINE BESYLATE 5 MG PO TABS: 5.0000 mg | ORAL_TABLET | Freq: Every day | ORAL | 0 refills | Status: DC

## 2019-07-26 MED ORDER — CHLORTHALIDONE 25 MG PO TABS: 25.0000 mg | ORAL_TABLET | Freq: Every day | ORAL | 1 refills | Status: DC

## 2019-07-26 MED ORDER — PAROXETINE HCL 10 MG PO TABS: 10.0000 mg | ORAL_TABLET | Freq: Every day | ORAL | 1 refills | Status: DC
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
## 2019-07-26 NOTE — Patient Instructions (Signed)
Fatty Liver Disease  Fatty liver disease occurs when too much fat has built up in your liver cells. Fatty liver disease is also called hepatic steatosis or steatohepatitis. The liver removes harmful substances from your bloodstream and produces fluids that your body needs. It also helps your body use and store energy from the food you eat. In many cases, fatty liver disease does not cause symptoms or problems. It is often diagnosed when tests are being done for other reasons. However, over time, fatty liver can cause inflammation that may lead to more serious liver problems, such as scarring of the liver (cirrhosis) and liver failure. Fatty liver is associated with insulin resistance, increased body fat, high blood pressure (hypertension), and high cholesterol. These are features of metabolic syndrome and increase your risk for stroke, diabetes, and heart disease. What are the causes? This condition may be caused by:  Drinking too much alcohol.  Poor nutrition.  Obesity.  Cushing's syndrome.  Diabetes.  High cholesterol.  Certain drugs.  Poisons.  Some viral infections.  Pregnancy. What increases the risk? You are more likely to develop this condition if you:  Abuse alcohol.  Are overweight.  Have diabetes.  Have hepatitis.  Have a high triglyceride level.  Are pregnant. What are the signs or symptoms? Fatty liver disease often does not cause symptoms. If symptoms do develop, they can include:  Fatigue.  Weakness.  Weight loss.  Confusion.  Abdominal pain.  Nausea and vomiting.  Yellowing of your skin and the white parts of your eyes (jaundice).  Itchy skin. How is this diagnosed? This condition may be diagnosed by:  A physical exam and medical history.  Blood tests.  Imaging tests, such as an ultrasound, CT scan, or MRI.  A liver biopsy. A small sample of liver tissue is removed using a needle. The sample is then looked at under a microscope. How  is this treated? Fatty liver disease is often caused by other health conditions. Treatment for fatty liver may involve medicines and lifestyle changes to manage conditions such as:  Alcoholism.  High cholesterol.  Diabetes.  Being overweight or obese. Follow these instructions at home:   Do not drink alcohol. If you have trouble quitting, ask your health care provider how to safely quit with the help of medicine or a supervised program. This is important to keep your condition from getting worse.  Eat a healthy diet as told by your health care provider. Ask your health care provider about working with a diet and nutrition specialist (dietitian) to develop an eating plan.  Exercise regularly. This can help you lose weight and control your cholesterol and diabetes. Talk to your health care provider about an exercise plan and which activities are best for you.  Take over-the-counter and prescription medicines only as told by your health care provider.  Keep all follow-up visits as told by your health care provider. This is important. Contact a health care provider if: You have trouble controlling your:  Blood sugar. This is especially important if you have diabetes.  Cholesterol.  Drinking of alcohol. Get help right away if:  You have abdominal pain.  You have jaundice.  You have nausea and vomiting.  You vomit blood or material that looks like coffee grounds.  You have stools that are black, tar-like, or bloody. Summary  Fatty liver disease develops when too much fat builds up in the cells of your liver.  Fatty liver disease often causes no symptoms or problems. However, over   time, fatty liver can cause inflammation that may lead to more serious liver problems, such as scarring of the liver (cirrhosis).  You are more likely to develop this condition if you abuse alcohol, are pregnant, are overweight, have diabetes, have hepatitis, or have high triglyceride  levels.  Contact your health care provider if you have trouble controlling your weight, blood sugar, cholesterol, or drinking of alcohol. This information is not intended to replace advice given to you by your health care provider. Make sure you discuss any questions you have with your health care provider. Document Revised: 06/10/2017 Document Reviewed: 04/06/2017 Elsevier Patient Education  Agenda. Amlodipine Oral Tablets What is this medicine? AMLODIPINE (am LOE di peen) is a calcium channel blocker. It relaxes your blood vessels and decreases the amount of work the heart has to do. It treats high blood pressure and/or prevents chest pain (also called angina). This medicine may be used for other purposes; ask your health care provider or pharmacist if you have questions. COMMON BRAND NAME(S): Norvasc What should I tell my health care provider before I take this medicine? They need to know if you have any of these conditions:  heart disease  liver disease  an unusual or allergic reaction to amlodipine, other drugs, foods, dyes, or preservatives  pregnant or trying to get pregnant  breast-feeding How should I use this medicine? Take this drug by mouth. Take it as directed on the prescription label at the same time every day. You can take it with or without food. If it upsets your stomach, take it with food. Keep taking it unless your health care provider tells you to stop. Talk to your health care provider about the use of this drug in children. While it may be prescribed for children as young as 6 for selected conditions, precautions do apply. Overdosage: If you think you have taken too much of this medicine contact a poison control center or emergency room at once. NOTE: This medicine is only for you. Do not share this medicine with others. What if I miss a dose? If you miss a dose, take it as soon as you can. If it is almost time for your next dose, take only that dose.  Do not take double or extra doses. What may interact with this medicine? This medicine may interact with the following medications:  clarithromycin  cyclosporine  diltiazem  itraconazole  simvastatin  tacrolimus This list may not describe all possible interactions. Give your health care provider a list of all the medicines, herbs, non-prescription drugs, or dietary supplements you use. Also tell them if you smoke, drink alcohol, or use illegal drugs. Some items may interact with your medicine. What should I watch for while using this medicine? Visit your health care provider for regular checks on your progress. Check your blood pressure as directed. Ask your health care provider what your blood pressure should be. Also, find out when you should contact him or her. Do not treat yourself for coughs, colds, or pain while you are using this drug without asking your health care provider for advice. Some drugs may increase your blood pressure. You may get drowsy or dizzy. Do not drive, use machinery, or do anything that needs mental alertness until you know how this drug affects you. Do not stand up or sit up quickly, especially if you are an older patient. This reduces the risk of dizzy or fainting spells. What side effects may I notice from receiving this medicine?  Side effects that you should report to your doctor or health care provider as soon as possible:  allergic reactions (skin rash, itching or hives; swelling of the face, lips, or tongue)  heart attack (trouble breathing; pain or tightness in the chest, neck, back or arms; unusually weak or tired)  low blood pressure (dizziness; feeling faint or lightheaded, falls; unusually weak or tired) Side effects that usually do not require medical attention (report these to your doctor or health care provider if they continue or are bothersome):  facial flushing  nausea  palpitations  stomach pain  sudden weight gain  swelling of  the ankles, feet, hands This list may not describe all possible side effects. Call your doctor for medical advice about side effects. You may report side effects to FDA at 1-800-FDA-1088. Where should I keep my medicine? Keep out of the reach of children and pets. Store at room temperature between 59 and 86 degrees F (15 and 30 degrees C). Protect from light and moisture. Keep the container tightly closed. Throw away any unused drug after the expiration date. NOTE: This sheet is a summary. It may not cover all possible information. If you have questions about this medicine, talk to your doctor, pharmacist, or health care provider.  2020 Elsevier/Gold Standard (2019-04-03 19:39:45)

## 2019-10-22 ENCOUNTER — Other Ambulatory Visit: Payer: Self-pay | Admitting: Family Medicine

## 2019-10-22 DIAGNOSIS — I10 Essential (primary) hypertension: Secondary | ICD-10-CM

## 2019-10-25 ENCOUNTER — Ambulatory Visit: Payer: Managed Care, Other (non HMO) | Admitting: Family Medicine

## 2019-11-07 ENCOUNTER — Ambulatory Visit: Payer: 59 | Admitting: Family Medicine

## 2019-11-20 ENCOUNTER — Other Ambulatory Visit: Payer: Self-pay

## 2019-11-20 ENCOUNTER — Ambulatory Visit: Payer: 59 | Admitting: Family Medicine

## 2019-11-21 ENCOUNTER — Ambulatory Visit (INDEPENDENT_AMBULATORY_CARE_PROVIDER_SITE_OTHER): Payer: 59 | Admitting: Family Medicine

## 2019-11-21 ENCOUNTER — Encounter: Payer: Self-pay | Admitting: Family Medicine

## 2019-11-21 VITALS — BP 142/88 | HR 92 | Temp 98.2°F | Ht 63.0 in | Wt 142.8 lb

## 2019-11-21 DIAGNOSIS — R7989 Other specified abnormal findings of blood chemistry: Secondary | ICD-10-CM

## 2019-11-21 DIAGNOSIS — E782 Mixed hyperlipidemia: Secondary | ICD-10-CM

## 2019-11-21 DIAGNOSIS — Z566 Other physical and mental strain related to work: Secondary | ICD-10-CM | POA: Diagnosis not present

## 2019-11-21 DIAGNOSIS — I10 Essential (primary) hypertension: Secondary | ICD-10-CM | POA: Diagnosis not present

## 2019-11-21 DIAGNOSIS — Z720 Tobacco use: Secondary | ICD-10-CM | POA: Insufficient documentation

## 2019-11-21 LAB — LIPID PANEL
Cholesterol: 218 mg/dL — ABNORMAL HIGH (ref 0–200)
HDL: 61.3 mg/dL (ref >39.00)
LDL Cholesterol: 120 mg/dL — ABNORMAL HIGH (ref 0–99)
NonHDL: 156.87
Total CHOL/HDL Ratio: 4
Triglycerides: 186 mg/dL — ABNORMAL HIGH (ref 0.0–149.0)
VLDL: 37.2 mg/dL (ref 0.0–40.0)

## 2019-11-21 LAB — COMPREHENSIVE METABOLIC PANEL
ALT: 39 U/L — ABNORMAL HIGH (ref 0–35)
AST: 26 U/L (ref 0–37)
Albumin: 4.5 g/dL (ref 3.5–5.2)
Alkaline Phosphatase: 47 U/L (ref 39–117)
BUN: 16 mg/dL (ref 6–23)
CO2: 28 mEq/L (ref 19–32)
Calcium: 9.8 mg/dL (ref 8.4–10.5)
Chloride: 99 mEq/L (ref 96–112)
Creatinine, Ser: 0.54 mg/dL (ref 0.40–1.20)
GFR: 123.11 mL/min (ref >60.00)
Glucose, Bld: 99 mg/dL (ref 70–99)
Potassium: 4.6 mEq/L (ref 3.5–5.1)
Sodium: 138 mEq/L (ref 135–145)
Total Bilirubin: 0.8 mg/dL (ref 0.2–1.2)
Total Protein: 7.1 g/dL (ref 6.0–8.3)

## 2019-11-21 LAB — CBC
HCT: 41.8 % (ref 36.0–46.0)
Hemoglobin: 13.5 g/dL (ref 12.0–15.0)
MCHC: 32.2 g/dL (ref 30.0–36.0)
MCV: 83.5 fl (ref 78.0–100.0)
Platelets: 372 10*3/uL (ref 150.0–400.0)
RBC: 5 Mil/uL (ref 3.87–5.11)
RDW: 13.1 % (ref 11.5–15.5)
WBC: 6.7 10*3/uL (ref 4.0–10.5)

## 2019-11-21 MED ORDER — PAROXETINE HCL 10 MG PO TABS: 10.0000 mg | ORAL_TABLET | Freq: Every day | ORAL | 1 refills | Status: AC

## 2019-11-21 MED ORDER — AMLODIPINE BESYLATE 5 MG PO TABS: ORAL_TABLET | ORAL | 0 refills | Status: DC

## 2019-11-21 MED ORDER — VARENICLINE TARTRATE 0.5 MG PO TABS: ORAL_TABLET | ORAL | 1 refills | Status: AC

## 2019-11-21 MED ORDER — CHLORTHALIDONE 25 MG PO TABS: ORAL_TABLET | ORAL | 1 refills | Status: DC
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
## 2019-11-21 NOTE — Patient Instructions (Signed)
Coping with Quitting Smoking  Quitting smoking is a physical and mental challenge. You will face cravings, withdrawal symptoms, and temptation. Before quitting, work with your health care provider to make a plan that can help you cope. Preparation can help you quit and keep you from giving in. How can I cope with cravings? Cravings usually last for 5-10 minutes. If you get through it, the craving will pass. Consider taking the following actions to help you cope with cravings:  Keep your mouth busy: ? Chew sugar-free gum. ? Suck on hard candies or a straw. ? Brush your teeth.  Keep your hands and body busy: ? Immediately change to a different activity when you feel a craving. ? Squeeze or play with a ball. ? Do an activity or a hobby, like making bead jewelry, practicing needlepoint, or working with wood. ? Mix up your normal routine. ? Take a short exercise break. Go for a quick walk or run up and down stairs. ? Spend time in public places where smoking is not allowed.  Focus on doing something kind or helpful for someone else.  Call a friend or family member to talk during a craving.  Join a support group.  Call a quit line, such as 1-800-QUIT-NOW.  Talk with your health care provider about medicines that might help you cope with cravings and make quitting easier for you. How can I deal with withdrawal symptoms? Your body may experience negative effects as it tries to get used to not having nicotine in the system. These effects are called withdrawal symptoms. They may include:  Feeling hungrier than normal.  Trouble concentrating.  Irritability.  Trouble sleeping.  Feeling depressed.  Restlessness and agitation.  Craving a cigarette. To manage withdrawal symptoms:  Avoid places, people, and activities that trigger your cravings.  Remember why you want to quit.  Get plenty of sleep.  Avoid coffee and other caffeinated drinks. These may worsen some of your  symptoms. How can I handle social situations? Social situations can be difficult when you are quitting smoking, especially in the first few weeks. To manage this, you can:  Avoid parties, bars, and other social situations where people might be smoking.  Avoid alcohol.  Leave right away if you have the urge to smoke.  Explain to your family and friends that you are quitting smoking. Ask for understanding and support.  Plan activities with friends or family where smoking is not an option. What are some ways I can cope with stress? Wanting to smoke may cause stress, and stress can make you want to smoke. Find ways to manage your stress. Relaxation techniques can help. For example:  Breathe slowly and deeply, in through your nose and out through your mouth.  Listen to soothing, relaxing music.  Talk with a family member or friend about your stress.  Light a candle.  Soak in a bath or take a shower.  Think about a peaceful place. What are some ways I can prevent weight gain? Be aware that many people gain weight after they quit smoking. However, not everyone does. To keep from gaining weight, have a plan in place before you quit and stick to the plan after you quit. Your plan should include:  Having healthy snacks. When you have a craving, it may help to: ? Eat plain popcorn, crunchy carrots, celery, or other cut vegetables. ? Chew sugar-free gum.  Changing how you eat: ? Eat small portion sizes at meals. ? Eat 4-6 small meals   throughout the day instead of 1-2 large meals a day. ? Be mindful when you eat. Do not watch television or do other things that might distract you as you eat.  Exercising regularly: ? Make time to exercise each day. If you do not have time for a long workout, do short bouts of exercise for 5-10 minutes several times a day. ? Do some form of strengthening exercise, like weight lifting, and some form of aerobic exercise, like running or swimming.  Drinking  plenty of water or other low-calorie or no-calorie drinks. Drink 6-8 glasses of water daily, or as much as instructed by your health care provider. Summary  Quitting smoking is a physical and mental challenge. You will face cravings, withdrawal symptoms, and temptation to smoke again. Preparation can help you as you go through these challenges.  You can cope with cravings by keeping your mouth busy (such as by chewing gum), keeping your body and hands busy, and making calls to family, friends, or a helpline for people who want to quit smoking.  You can cope with withdrawal symptoms by avoiding places where people smoke, avoiding drinks with caffeine, and getting plenty of rest.  Ask your health care provider about the different ways to prevent weight gain, avoid stress, and handle social situations. This information is not intended to replace advice given to you by your health care provider. Make sure you discuss any questions you have with your health care provider. Document Revised: 06/10/2017 Document Reviewed: 06/25/2016 Elsevier Patient Education  2020 [employer].  Managing Your Hypertension Hypertension is commonly called high blood pressure. This is when the force of your blood pressing against the walls of your arteries is too strong. Arteries are blood vessels that carry blood from your heart throughout your body. Hypertension forces the heart to work harder to pump blood, and may cause the arteries to become narrow or stiff. Having untreated or uncontrolled hypertension can cause heart attack, stroke, kidney disease, and other problems. What are blood pressure readings? A blood pressure reading consists of a higher number over a lower number. Ideally, your blood pressure should be below 120/80. The first ("top") number is called the systolic pressure. It is a measure of the pressure in your arteries as your heart beats. The second ("bottom") number is called the diastolic pressure. It  is a measure of the pressure in your arteries as the heart relaxes. What does my blood pressure reading mean? Blood pressure is classified into four stages. Based on your blood pressure reading, your health care provider may use the following stages to determine what type of treatment you need, if any. Systolic pressure and diastolic pressure are measured in a unit called mm Hg. Normal  Systolic pressure: below 655.  Diastolic pressure: below 80. Elevated  Systolic pressure: 374-827.  Diastolic pressure: below 80. Hypertension stage 1  Systolic pressure: 078-675.  Diastolic pressure: 44-92. Hypertension stage 2  Systolic pressure: 010 or above.  Diastolic pressure: 90 or above. What health risks are associated with hypertension? Managing your hypertension is an important responsibility. Uncontrolled hypertension can lead to:  A heart attack.  A stroke.  A weakened blood vessel (aneurysm).  Heart failure.  Kidney damage.  Eye damage.  Metabolic syndrome.  Memory and concentration problems. What changes can I make to manage my hypertension? Hypertension can be managed by making lifestyle changes and possibly by taking medicines. Your health care provider will help you make a plan to bring your blood pressure within  a normal range. Eating and drinking   Eat a diet that is high in fiber and potassium, and low in salt (sodium), added sugar, and fat. An example eating plan is called the DASH (Dietary Approaches to Stop Hypertension) diet. To eat this way: ? Eat plenty of fresh fruits and vegetables. Try to fill half of your plate at each meal with fruits and vegetables. ? Eat whole grains, such as whole wheat pasta, brown rice, or whole grain bread. Fill about one quarter of your plate with whole grains. ? Eat low-fat diary products. ? Avoid fatty cuts of meat, processed or cured meats, and poultry with skin. Fill about one quarter of your plate with lean proteins such as  fish, chicken without skin, beans, eggs, and tofu. ? Avoid premade and processed foods. These tend to be higher in sodium, added sugar, and fat.  Reduce your daily sodium intake. Most people with hypertension should eat less than 1,500 mg of sodium a day.  Limit alcohol intake to no more than 1 drink a day for nonpregnant women and 2 drinks a day for men. One drink equals 12 oz of beer, 5 oz of wine, or 1 oz of hard liquor. Lifestyle  Work with your health care provider to maintain a healthy body weight, or to lose weight. Ask what an ideal weight is for you.  Get at least 30 minutes of exercise that causes your heart to beat faster (aerobic exercise) most days of the week. Activities may include walking, swimming, or biking.  Include exercise to strengthen your muscles (resistance exercise), such as weight lifting, as part of your weekly exercise routine. Try to do these types of exercises for 30 minutes at least 3 days a week.  Do not use any products that contain nicotine or tobacco, such as cigarettes and e-cigarettes. If you need help quitting, ask your health care provider.  Control any long-term (chronic) conditions you have, such as high cholesterol or diabetes. Monitoring  Monitor your blood pressure at home as told by your health care provider. Your personal target blood pressure may vary depending on your medical conditions, your age, and other factors.  Have your blood pressure checked regularly, as often as told by your health care provider. Working with your health care provider  Review all the medicines you take with your health care provider because there may be side effects or interactions.  Talk with your health care provider about your diet, exercise habits, and other lifestyle factors that may be contributing to hypertension.  Visit your health care provider regularly. Your health care provider can help you create and adjust your plan for managing hypertension. Will  I need medicine to control my blood pressure? Your health care provider may prescribe medicine if lifestyle changes are not enough to get your blood pressure under control, and if:  Your systolic blood pressure is 130 or higher.  Your diastolic blood pressure is 80 or higher. Take medicines only as told by your health care provider. Follow the directions carefully. Blood pressure medicines must be taken as prescribed. The medicine does not work as well when you skip doses. Skipping doses also puts you at risk for problems. Contact a health care provider if:  You think you are having a reaction to medicines you have taken.  You have repeated (recurrent) headaches.  You feel dizzy.  You have swelling in your ankles.  You have trouble with your vision. Get help right away if:  You develop  a severe headache or confusion.  You have unusual weakness or numbness, or you feel faint.  You have severe pain in your chest or abdomen.  You vomit repeatedly.  You have trouble breathing. Summary  Hypertension is when the force of blood pumping through your arteries is too strong. If this condition is not controlled, it may put you at risk for serious complications.  Your personal target blood pressure may vary depending on your medical conditions, your age, and other factors. For most people, a normal blood pressure is less than 120/80.  Hypertension is managed by lifestyle changes, medicines, or both. Lifestyle changes include weight loss, eating a healthy, low-sodium diet, exercising more, and limiting alcohol. This information is not intended to replace advice given to you by your health care provider. Make sure you discuss any questions you have with your health care provider. Document Revised: 10/20/2018 Document Reviewed: 05/26/2016 Elsevier Patient Education  2020 [employer]  Mindfulness-Based Stress Reduction Mindfulness-based stress reduction (MBSR) is a program that helps  people learn to practice mindfulness. Mindfulness is the practice of intentionally paying attention to the present moment. It can be learned and practiced through techniques such as education, breathing exercises, meditation, and yoga. MBSR includes several mindfulness techniques in one program. MBSR works best when you understand the treatment, are willing to try new things, and can commit to spending time practicing what you learn. MBSR training may include learning about:  How your emotions, thoughts, and reactions affect your body.  New ways to respond to things that cause negative thoughts to start (triggers).  How to notice your thoughts and let go of them.  Practicing awareness of everyday things that you normally do without thinking.  The techniques and goals of different types of meditation. What are the benefits of MBSR? MBSR can have many benefits, which include helping you to:  Develop self-awareness. This refers to knowing and understanding yourself.  Learn skills and attitudes that help you to participate in your own health care.  Learn new ways to care for yourself.  Be more accepting about how things are, and let things go.  Be less judgmental and approach things with an open mind.  Be patient with yourself and trust yourself more. MBSR has also been shown to:  Reduce negative emotions, such as depression and anxiety.  Improve memory and focus.  Change how you sense and approach pain.  Boost your body's ability to fight infections.  Help you connect better with other people.  Improve your sense of well-being. Follow these instructions at home:   Find a local in-person or online MBSR program.  Set aside some time regularly for mindfulness practice.  Find a mindfulness practice that works best for you. This may include one or more of the following: ? Meditation. Meditation involves focusing your mind on a certain thought or activity. ? Breathing awareness  exercises. These help you to stay present by focusing on your breath. ? Body scan. For this practice, you lie down and pay attention to each part of your body from head to toe. You can identify tension and soreness and intentionally relax parts of your body. ? Yoga. Yoga involves stretching and breathing, and it can improve your ability to move and be flexible. It can also provide an experience of testing your body's limits, which can help you release stress. ? Mindful eating. This way of eating involves focusing on the taste, texture, color, and smell of each bite of food. Because this  slows down eating and helps you feel full sooner, it can be an important part of a weight-loss plan.  Find a podcast or recording that provides guidance for breathing awareness, body scan, or meditation exercises. You can listen to these any time when you have a free moment to rest without distractions.  Follow your treatment plan as told by your health care provider. This may include taking regular medicines and making changes to your diet or lifestyle as recommended. How to practice mindfulness To do a basic awareness exercise:  Find a comfortable place to sit.  Pay attention to the present moment. Observe your thoughts, feelings, and surroundings just as they are.  Avoid placing judgment on yourself, your feelings, or your surroundings. Make note of any judgment that comes up, and let it go.  Your mind may wander, and that is okay. Make note of when your thoughts drift, and return your attention to the present moment. To do basic mindfulness meditation:  Find a comfortable place to sit. This may include a stable chair or a firm floor cushion. ? Sit upright with your back straight. Let your arms fall next to your side with your hands resting on your legs. ? If sitting in a chair, rest your feet flat on the floor. ? If sitting on a cushion, cross your legs in front of you.  Keep your head in a neutral  position with your chin dropped slightly. Relax your jaw and rest the tip of your tongue on the roof of your mouth. Drop your gaze to the floor. You can close your eyes if you like.  Breathe normally and pay attention to your breath. Feel the air moving in and out of your nose. Feel your belly expanding and relaxing with each breath.  Your mind may wander, and that is okay. Make note of when your thoughts drift, and return your attention to your breath.  Avoid placing judgment on yourself, your feelings, or your surroundings. Make note of any judgment or feelings that come up, let them go, and bring your attention back to your breath.  When you are ready, lift your gaze or open your eyes. Pay attention to how your body feels after the meditation. Where to find more information You can find more information about MBSR from:  Your health care provider.  Community-based meditation centers or programs.  Programs offered near you. Summary  Mindfulness-based stress reduction (MBSR) is a program that teaches you how to intentionally pay attention to the present moment. It is used with other treatments to help you cope better with daily stress, emotions, and pain.  MBSR focuses on developing self-awareness, which allows you to respond to life stress without judgment or negative emotions.  MBSR programs may involve learning different mindfulness practices, such as breathing exercises, meditation, yoga, body scan, or mindful eating. Find a mindfulness practice that works best for you, and set aside time for it on a regular basis. This information is not intended to replace advice given to you by your health care provider. Make sure you discuss any questions you have with your health care provider. Document Revised: 06/10/2017 Document Reviewed: 11/04/2016 Elsevier Patient Education  2020 [employer] Inc[employer]  Preventing High Cholesterol Cholesterol is a white, waxy substance similar to fat that the  human body needs to help build cells. The liver makes all the cholesterol that a person's body needs. Having high cholesterol (hypercholesterolemia) increases a person's risk for heart disease and stroke. Extra (excess) cholesterol comes  from the food the person eats. High cholesterol can often be prevented with diet and lifestyle changes. If you already have high cholesterol, you can control it with diet and lifestyle changes and with medicine. How can high cholesterol affect me? If you have high cholesterol, deposits (plaques) may build up on the walls of your arteries. The arteries are the blood vessels that carry blood away from your heart. Plaques make the arteries narrower and stiffer. This can limit or block blood flow and cause blood clots to form. Blood clots:  Are tiny balls of cells that form in your blood.  Can move to the heart or brain, causing a heart attack or stroke. Plaques in arteries greatly increase your risk for heart attack and stroke.Making diet and lifestyle changes can reduce your risk for these conditions that may threaten your life. What can increase my risk? This condition is more likely to develop in people who:  Eat foods that are high in saturated fat or cholesterol. Saturated fat is mostly found in: ? Foods that contain animal fat, such as red meat and some dairy products. ? Certain fatty foods made from plants, such as tropical oils.  Are overweight.  Are not getting enough exercise.  Have a family history of high cholesterol. What actions can I take to prevent this? Nutrition   Eat less saturated fat.  Avoid trans fats (partially hydrogenated oils). These are often found in margarine and in some baked goods, fried foods, and snacks bought in packages.  Avoid precooked or cured meat, such as sausages or meat loaves.  Avoid foods and drinks that have added sugars.  Eat more fruits, vegetables, and whole grains.  Choose healthy sources of protein,  such as fish, poultry, lean cuts of red meat, beans, peas, lentils, and nuts.  Choose healthy sources of fat, such as: ? Nuts. ? Vegetable oils, especially olive oil. ? Fish that have healthy fats (omega-3 fatty acids), such as mackerel or salmon. The items listed above may not be a complete list of recommended foods and beverages. Contact a dietitian for more information. Lifestyle  Lose weight if you are overweight. Losing 5-10 lb (2.3-4.5 kg) can help prevent or control high cholesterol. It can also lower your risk for diabetes and high blood pressure. Ask your health care provider to help you with a diet and exercise plan to lose weight safely.  Do not use any products that contain nicotine or tobacco, such as cigarettes, e-cigarettes, and chewing tobacco. If you need help quitting, ask your health care provider.  Limit your alcohol intake. ? Do not drink alcohol if:  Your health care provider tells you not to drink.  You are pregnant, may be pregnant, or are planning to become pregnant. ? If you drink alcohol:  Limit how much you use to:  0-1 drink a day for women.  0-2 drinks a day for men.  Be aware of how much alcohol is in your drink. In the [place] one drink equals one 12 oz bottle of beer (355 mL), one 5 oz glass of wine (148 mL), or one 1 oz glass of hard liquor (44 mL). Activity   Get enough exercise. Each week, do at least 150 minutes of exercise that takes a medium level of effort (moderate-intensity exercise). ? This is exercise that:  Makes your heart beat faster and makes you breathe harder than usual.  Allows you to still be able to talk. ? You could exercise in short sessions several  times a day or longer sessions a few times a week. For example, on 5 days each week, you could walk fast or ride your bike 3 times a day for 10 minutes each time.  Do exercises as told by your health care provider. Medicines  In addition to diet and lifestyle changes, your  health care provider may recommend medicines to help lower cholesterol. This may be a medicine to lower the amount of cholesterol your liver makes. You may need medicine if: ? Diet and lifestyle changes do not lower your cholesterol enough. ? You have high cholesterol and other risk factors for heart disease or stroke.  Take over-the-counter and prescription medicines only as told by your health care provider. General information  Manage your risk factors for high cholesterol. Talk with your health care provider about all your risk factors and how to lower your risk.  Manage other conditions that you have, such as diabetes or high blood pressure (hypertension).  Have blood tests to check your cholesterol levels at regular points in time as told by your health care provider.  Keep all follow-up visits as told by your health care provider. This is important. Where to find more information  American Heart Association: www.heart.org  National Heart, Lung, and Blood Institute: https://wilson-eaton.com/ Summary  High cholesterol increases your risk for heart disease and stroke. By keeping your cholesterol level low, you can reduce your risk for these conditions.  High cholesterol can often be prevented with diet and lifestyle changes.  Work with your health care provider to manage your risk factors, and have your blood tested regularly. This information is not intended to replace advice given to you by your health care provider. Make sure you discuss any questions you have with your health care provider. Document Revised: 10/20/2018 Document Reviewed: 03/06/2016 Elsevier Patient Education  [place]. Varenicline oral tablets What is this medicine? VARENICLINE (var e NI kleen) is used to help people quit smoking. It is used with a patient support program recommended by your physician. This medicine may be used for other purposes; ask your health care provider or pharmacist if you have  questions. COMMON BRAND NAME(S): Chantix What should I tell my health care provider before I take this medicine? They need to know if you have any of these conditions:  heart disease  if you often drink alcohol  kidney disease  mental illness  on hemodialysis  seizures  history of stroke  suicidal thoughts, plans, or attempt; a previous suicide attempt by you or a family member  an unusual or allergic reaction to varenicline, other medicines, foods, dyes, or preservatives  pregnant or trying to get pregnant  breast-feeding How should I use this medicine? Take this medicine by mouth after eating. Take with a full glass of water. Follow the directions on the prescription label. Take your doses at regular intervals. Do not take your medicine more often than directed. There are 3 ways you can use this medicine to help you quit smoking; talk to your health care professional to decide which plan is right for you: 1) you can choose a quit date and start this medicine 1 week before the quit date, or, 2) you can start taking this medicine before you choose a quit date, and then pick a quit date between day 8 and 35 days of treatment, or, 3) if you are not sure that you are able or willing to quit smoking right away, start taking this medicine and slowly decrease  the amount you smoke as directed by your health care professional with the goal of being cigarette-free by week 12 of treatment. Stick to your plan; ask about support groups or other ways to help you remain cigarette-free. If you are motivated to quit smoking and did not succeed during a previous attempt with this medicine for reasons other than side effects, or if you returned to smoking after this treatment, speak with your health care professional about whether another course of this medicine may be right for you. A special MedGuide will be given to you by the pharmacist with each prescription and refill. Be sure to read this  information carefully each time. Talk to your pediatrician regarding the use of this medicine in children. This medicine is not approved for use in children. Overdosage: If you think you have taken too much of this medicine contact a poison control center or emergency room at once. NOTE: This medicine is only for you. Do not share this medicine with others. What if I miss a dose? If you miss a dose, take it as soon as you can. If it is almost time for your next dose, take only that dose. Do not take double or extra doses. What may interact with this medicine?  alcohol  insulin  other medicines used to help people quit smoking  theophylline  warfarin This list may not describe all possible interactions. Give your health care provider a list of all the medicines, herbs, non-prescription drugs, or dietary supplements you use. Also tell them if you smoke, drink alcohol, or use illegal drugs. Some items may interact with your medicine. What should I watch for while using this medicine? It is okay if you do not succeed at your attempt to quit and have a cigarette. You can still continue your quit attempt and keep using this medicine as directed. Just throw away your cigarettes and get back to your quit plan. Talk to your health care provider before using other treatments to quit smoking. Using this medicine with other treatments to quit smoking may increase the risk for side effects compared to using a treatment alone. You may get drowsy or dizzy. Do not drive, use machinery, or do anything that needs mental alertness until you know how this medicine affects you. Do not stand or sit up quickly, especially if you are an older patient. This reduces the risk of dizzy or fainting spells. Decrease the number of alcoholic beverages that you drink during treatment with this medicine until you know if this medicine affects your ability to tolerate alcohol. Some people have experienced increased drunkenness  (intoxication), unusual or sometimes aggressive behavior, or no memory of things that have happened (amnesia) during treatment with this medicine. Sleepwalking can happen during treatment with this medicine, and can sometimes lead to behavior that is harmful to you, other people, or property. Stop taking this medicine and tell your doctor if you start sleepwalking or have other unusual sleep-related activity. After taking this medicine, you may get up out of bed and do an activity that you do not know you are doing. The next morning, you may have no memory of this. Activities include driving a car ("sleep-driving"), making and eating food, talking on the phone, sexual activity, and sleep-walking. Serious injuries have occurred. Stop the medicine and call your doctor right away if you find out you have done any of these activities. Do not take this medicine if you have used alcohol that evening. Do not take it  if you have taken another medicine for sleep. The risk of doing these sleep-related activities is higher. Patients and their families should watch out for new or worsening depression or thoughts of suicide. Also watch out for sudden changes in feelings such as feeling anxious, agitated, panicky, irritable, hostile, aggressive, impulsive, severely restless, overly excited and hyperactive, or not being able to sleep. If this happens, call your health care professional. If you have diabetes and you quit smoking, the effects of insulin may be increased and you may need to reduce your insulin dose. Check with your doctor or health care professional about how you should adjust your insulin dose. What side effects may I notice from receiving this medicine? Side effects that you should report to your doctor or health care professional as soon as possible:  allergic reactions like skin rash, itching or hives, swelling of the face, lips, tongue, or throat  acting aggressive, being angry or violent, or acting on  dangerous impulses  breathing problems  changes in emotions or moods  chest pain or chest tightness  feeling faint or lightheaded, falls  hallucination, loss of contact with reality  mouth sores  redness, blistering, peeling or loosening of the skin, including inside the mouth  signs and symptoms of a stroke like changes in vision; confusion; trouble speaking or understanding; severe headaches; sudden numbness or weakness of the face, arm or leg; trouble walking; dizziness; loss of balance or coordination  seizures  sleepwalking  suicidal thoughts or other mood changes Side effects that usually do not require medical attention (report to your doctor or health care professional if they continue or are bothersome):  constipation  gas  headache  nausea, vomiting  strange dreams  trouble sleeping This list may not describe all possible side effects. Call your doctor for medical advice about side effects. You may report side effects to FDA at 1-800-FDA-1088. Where should I keep my medicine? Keep out of the reach of children. Store at room temperature between 15 and 30 degrees C (59 and 86 degrees F). Throw away any unused medicine after the expiration date. NOTE: This sheet is a summary. It may not cover all possible information. If you have questions about this medicine, talk to your doctor, pharmacist, or health care provider.  2020 [employer] (2018-06-16 14:27:36)

## 2020-01-02 ENCOUNTER — Ambulatory Visit: Payer: 59 | Admitting: Family Medicine

## 2020-01-10 ENCOUNTER — Encounter: Payer: Self-pay | Admitting: Family Medicine

## 2020-01-10 ENCOUNTER — Telehealth: Payer: Self-pay | Admitting: Family Medicine

## 2020-01-10 NOTE — Telephone Encounter (Signed)
Pt was no show for appt 01/02/2020. This is the first no show for pt. Letter has been mailed.  PCP,  Please reply back with corresponding letter matching appropriate follow up needs.  A - No follow up necessary B - Follow up urgent - locate patient immediately to schedule appointment. C - Follow up necessary. Contact patient and schedule visit w/in 7 days. D - Follow up necessary. Contact patient and schedule visit w/in 2-4 weeks.

## 2020-02-06 ENCOUNTER — Telehealth: Payer: Self-pay | Admitting: Family Medicine

## 2020-02-06 NOTE — Telephone Encounter (Signed)
Called patient to reschedule missed appt from 01/02/20. Left voicemail to call office when ready to schedule.

## 2020-04-14 ENCOUNTER — Other Ambulatory Visit: Payer: Self-pay | Admitting: Family Medicine

## 2020-04-14 DIAGNOSIS — I10 Essential (primary) hypertension: Secondary | ICD-10-CM

## 2020-07-18 ENCOUNTER — Other Ambulatory Visit: Payer: Self-pay | Admitting: Family Medicine

## 2020-07-18 DIAGNOSIS — I10 Essential (primary) hypertension: Secondary | ICD-10-CM

## 2020-07-18 DIAGNOSIS — Z566 Other physical and mental strain related to work: Secondary | ICD-10-CM

## 2020-07-21 ENCOUNTER — Ambulatory Visit: Payer: 59 | Admitting: Family Medicine

## 2021-02-28 ENCOUNTER — Other Ambulatory Visit: Payer: Self-pay | Admitting: Family Medicine

## 2021-02-28 DIAGNOSIS — I10 Essential (primary) hypertension: Secondary | ICD-10-CM

## 2021-07-06 IMAGING — US US ABDOMEN LIMITED
1 series · 14 of 25 positions shown · non-contrast
Comparison: No prior.

CLINICAL DATA: Elevated LFTs.

EXAM:
ULTRASOUND ABDOMEN LIMITED RIGHT UPPER QUADRANT

[Series 1: us abdomen limited · 0.17mm/px · 14 of 48 slices shown]
[im 1/48]
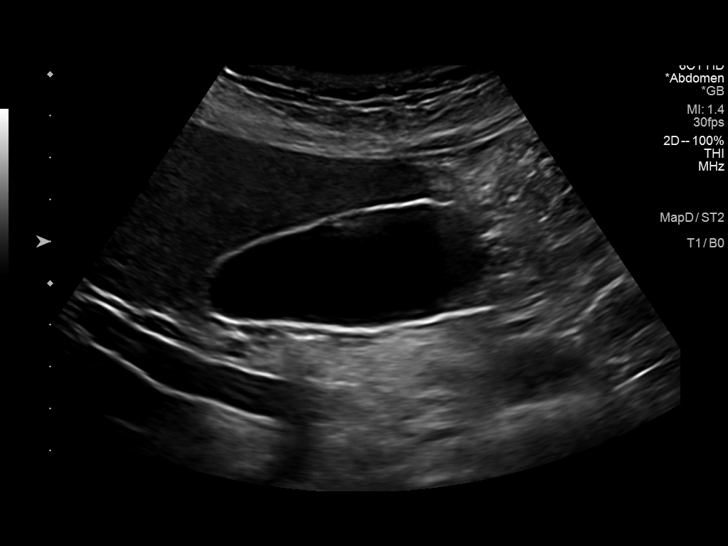
[im 4/48]
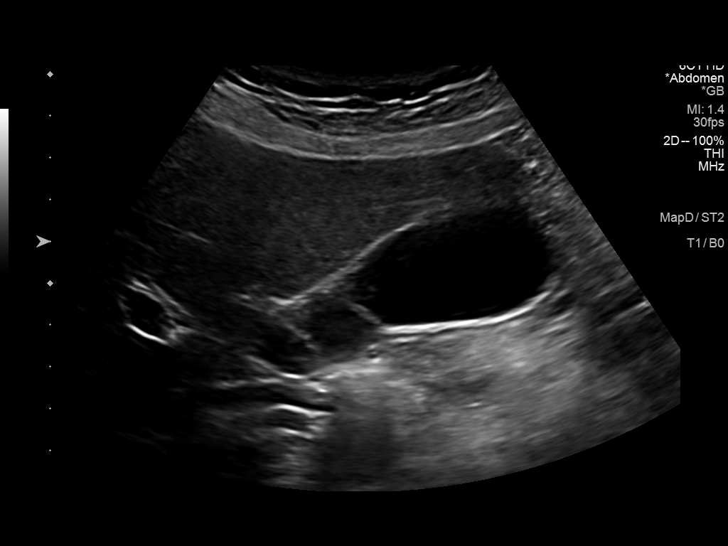
[im 8/48]
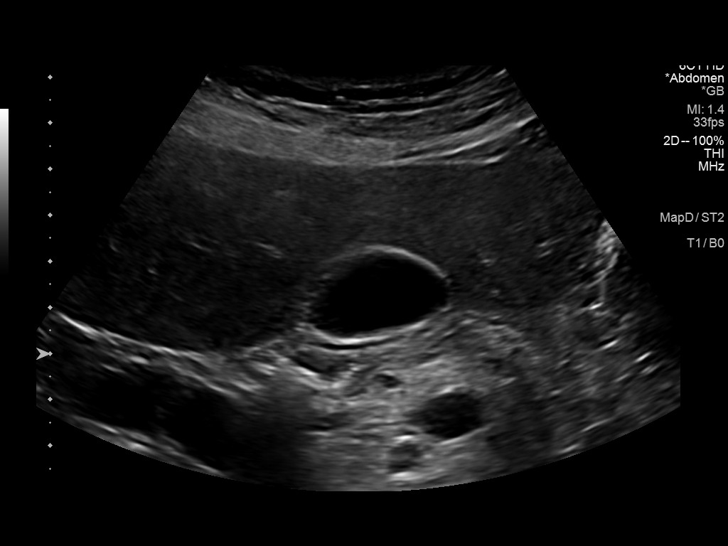
[im 12/48]
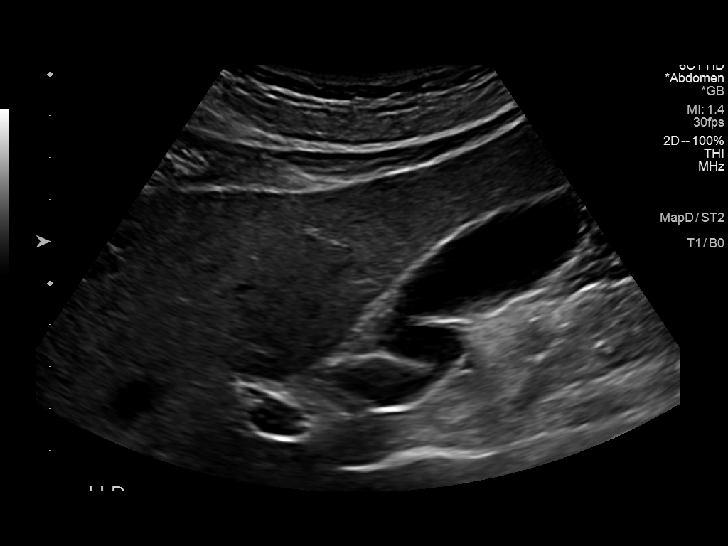
[im 16/48]
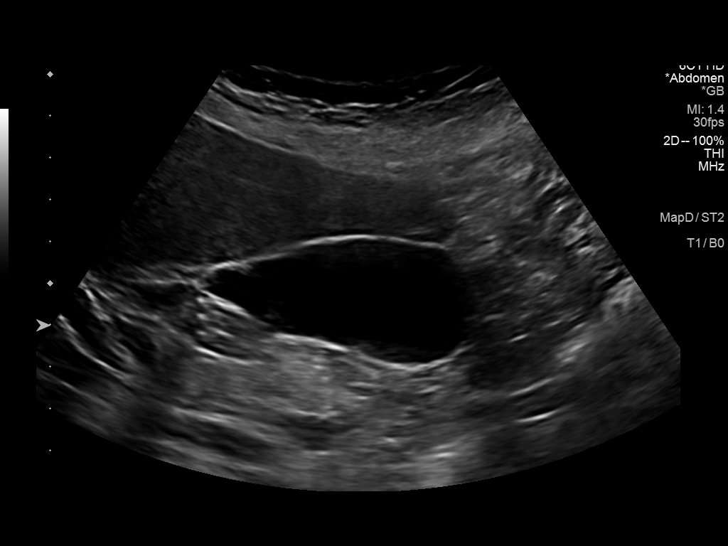
[im 18/48]
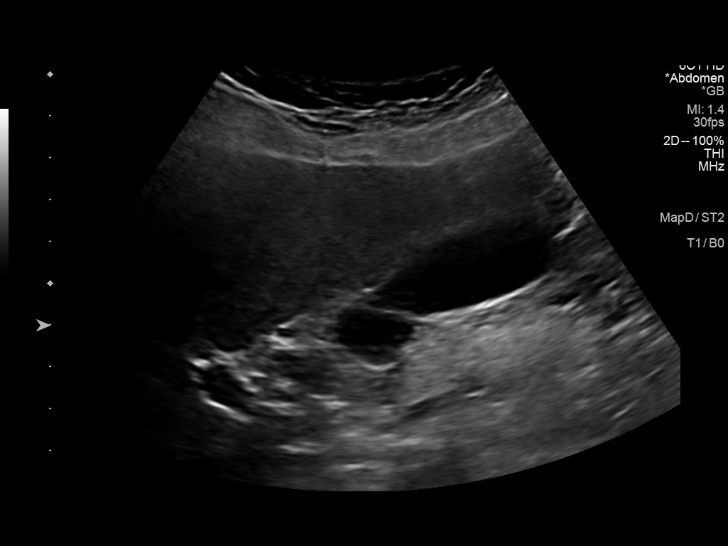
[im 22/48]
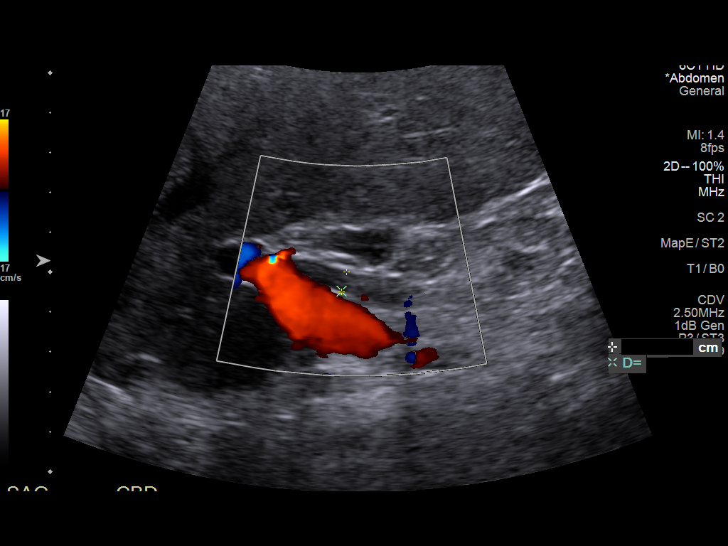
[im 26/48]
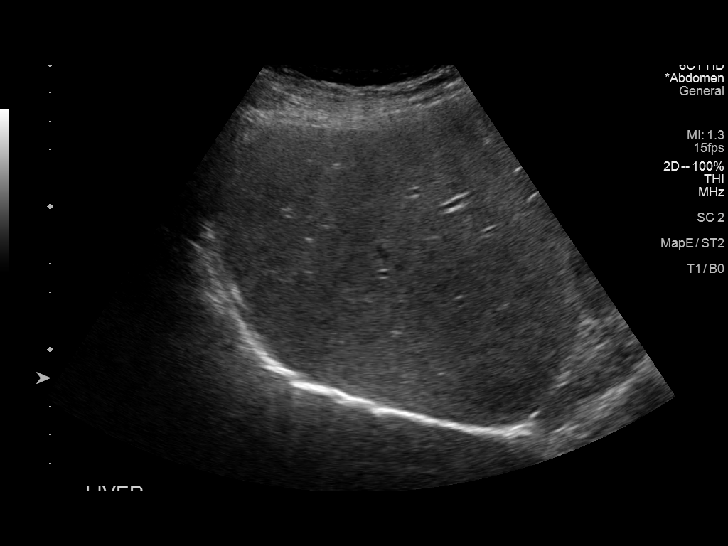
[im 30/48]
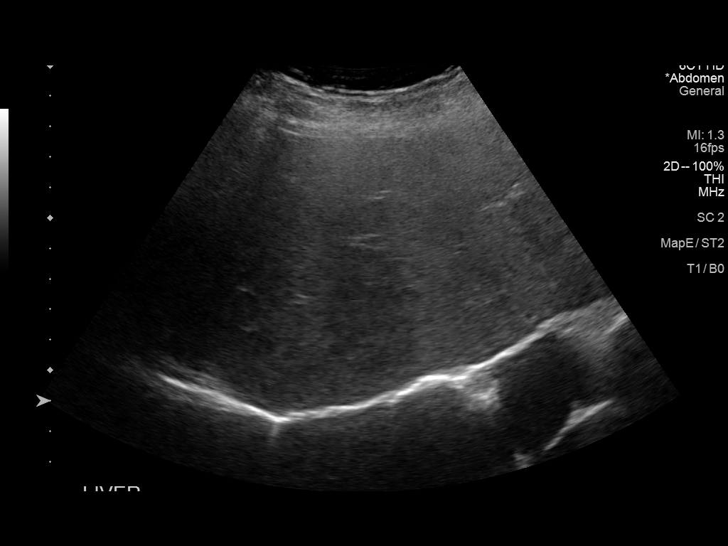
[im 32/48]
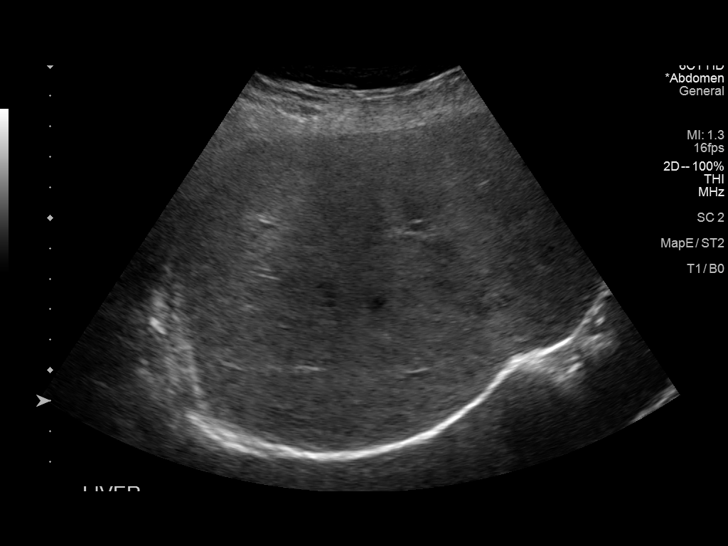
[im 36/48]
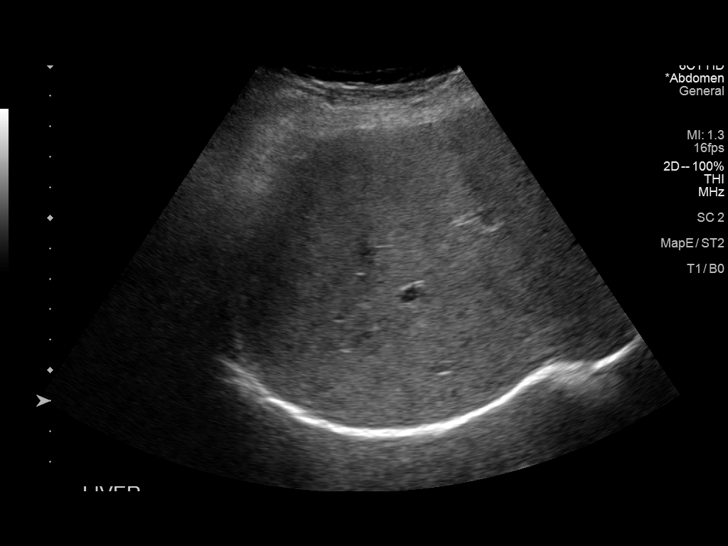
[im 40/48]
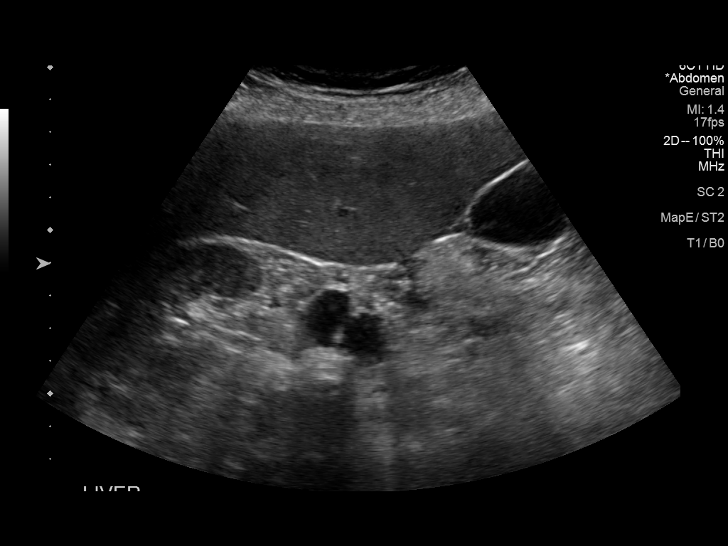
[im 44/48]
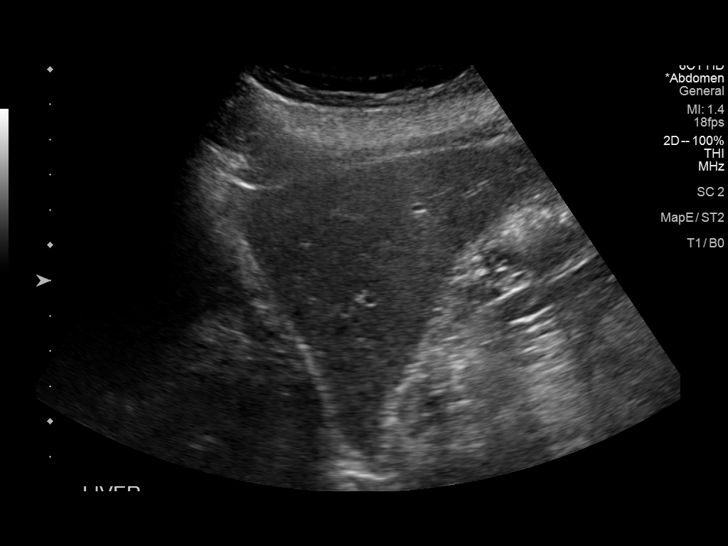
[im 48/48]
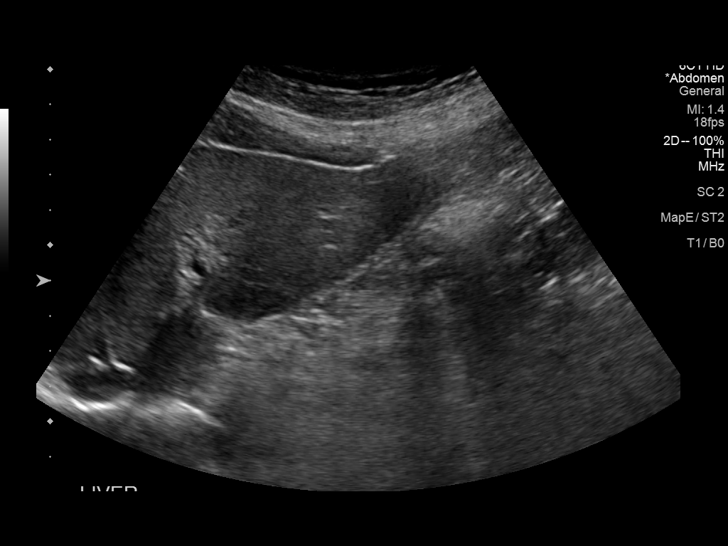

[14 of 25 positions shown; findings below may reference images not displayed]

FINDINGS: Gallbladder:

No gallstones or wall thickening visualized. No sonographic Murphy
sign noted by sonographer.

Common bile duct:

Diameter: 5.1 mm

Liver:

Increased echogenicity consistent with fatty infiltration or
hepatocellular disease. No focal mass noted. Portal vein is patent
on color Doppler imaging with normal direction of blood flow towards
the liver.

Other: None.
IMPRESSION: 1. Increased hepatic echogenicity consistent fatty infiltration or
hepatocellular disease.

2.  No gallstones or biliary distention.
